# Patient Record
Sex: Female | Born: 1980 | Race: White | Hispanic: No | Marital: Married | Smoking: Current some day smoker
Health system: Southern US, Community
[De-identification: ages and names within clinical notes are randomized; demographics above are authoritative.]

## PROBLEM LIST (undated history)

## (undated) DIAGNOSIS — K581 Irritable bowel syndrome with constipation: Secondary | ICD-10-CM

## (undated) DIAGNOSIS — L0390 Cellulitis, unspecified: Secondary | ICD-10-CM

## (undated) DIAGNOSIS — R21 Rash and other nonspecific skin eruption: Secondary | ICD-10-CM

## (undated) DIAGNOSIS — I519 Heart disease, unspecified: Secondary | ICD-10-CM

## (undated) DIAGNOSIS — I509 Heart failure, unspecified: Secondary | ICD-10-CM

## (undated) DIAGNOSIS — F209 Schizophrenia, unspecified: Secondary | ICD-10-CM

## (undated) DIAGNOSIS — N939 Abnormal uterine and vaginal bleeding, unspecified: Secondary | ICD-10-CM

## (undated) HISTORY — DX: Schizophrenia, unspecified: F20.9

## (undated) HISTORY — DX: Heart failure, unspecified: I50.9

## (undated) HISTORY — DX: Heart disease, unspecified: I51.9

---
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
## 2005-12-15 HISTORY — PX: TUBAL LIGATION: SHX77

## 2009-07-08 ENCOUNTER — Emergency Department (HOSPITAL_COMMUNITY): Admission: EM | Admit: 2009-07-08 | Discharge: 2009-07-08 | Payer: Self-pay | Admitting: Emergency Medicine

## 2009-12-07 ENCOUNTER — Observation Stay (HOSPITAL_COMMUNITY): Admission: EM | Admit: 2009-12-07 | Discharge: 2009-12-07 | Payer: Self-pay | Admitting: Emergency Medicine

## 2011-03-17 LAB — CBC
HCT: 37.2 % (ref 36.0–46.0)
Hemoglobin: 12.7 g/dL (ref 12.0–15.0)
MCHC: 34.2 g/dL (ref 30.0–36.0)
MCV: 95.1 fL (ref 78.0–100.0)
Platelets: 202 10*3/uL (ref 150–400)
RBC: 3.91 MIL/uL (ref 3.87–5.11)
WBC: 6.5 10*3/uL (ref 4.0–10.5)

## 2011-03-17 LAB — URINALYSIS, ROUTINE W REFLEX MICROSCOPIC
Bilirubin Urine: NEGATIVE
Glucose, UA: NEGATIVE mg/dL
Hgb urine dipstick: NEGATIVE
Protein, ur: NEGATIVE mg/dL
Specific Gravity, Urine: 1.01 (ref 1.005–1.030)
Urobilinogen, UA: 0.2 mg/dL (ref 0.0–1.0)
pH: 7.5 (ref 5.0–8.0)

## 2011-03-17 LAB — COMPREHENSIVE METABOLIC PANEL
ALT: 10 U/L (ref 0–35)
AST: 17 U/L (ref 0–37)
Albumin: 3.4 g/dL — ABNORMAL LOW (ref 3.5–5.2)
BUN: 5 mg/dL — ABNORMAL LOW (ref 6–23)
CO2: 26 mEq/L (ref 19–32)
Calcium: 8.1 mg/dL — ABNORMAL LOW (ref 8.4–10.5)
Chloride: 108 mEq/L (ref 96–112)
Creatinine, Ser: 0.56 mg/dL (ref 0.4–1.2)
GFR calc non Af Amer: >60 mL/min (ref >60)
Glucose, Bld: 101 mg/dL — ABNORMAL HIGH (ref 70–99)
Potassium: 4 mEq/L (ref 3.5–5.1)
Sodium: 137 mEq/L (ref 135–145)
Total Protein: 5.9 g/dL — ABNORMAL LOW (ref 6.0–8.3)

## 2011-03-17 LAB — SEDIMENTATION RATE: Sed Rate: 5 mm/hr (ref 0–22)

## 2011-03-17 LAB — POTASSIUM: Potassium: 3.6 mEq/L (ref 3.5–5.1)

## 2012-06-22 ENCOUNTER — Encounter (HOSPITAL_COMMUNITY): Payer: Self-pay | Admitting: *Deleted

## 2012-06-22 ENCOUNTER — Emergency Department (HOSPITAL_COMMUNITY)
Admission: EM | Admit: 2012-06-22 | Discharge: 2012-06-23 | Disposition: A | Discharge status: Home / Self Care | Payer: Medicaid Other | Attending: Emergency Medicine | Admitting: Emergency Medicine

## 2012-06-22 DIAGNOSIS — T7840XA Allergy, unspecified, initial encounter: Secondary | ICD-10-CM

## 2012-06-22 DIAGNOSIS — F17200 Nicotine dependence, unspecified, uncomplicated: Secondary | ICD-10-CM | POA: Insufficient documentation

## 2012-06-22 DIAGNOSIS — R21 Rash and other nonspecific skin eruption: Secondary | ICD-10-CM | POA: Insufficient documentation

## 2012-06-22 HISTORY — DX: Cellulitis, unspecified: L03.90

## 2012-06-22 MED ORDER — MORPHINE SULFATE 2 MG/ML IJ SOLN
2.0000 mg | Freq: Once | INTRAMUSCULAR | Status: AC
  Administered 2012-06-22: 2 mg via INTRAVENOUS
  Filled 2012-06-22: qty 1

## 2012-06-22 MED ORDER — EPINEPHRINE 0.3 MG/0.3ML IJ DEVI
INTRAMUSCULAR | Status: AC
  Administered 2012-06-22: 0.3 mg
  Filled 2012-06-22: qty 0.3

## 2012-06-22 MED ORDER — FAMOTIDINE IN NACL 20-0.9 MG/50ML-% IV SOLN
20.0000 mg | Freq: Once | INTRAVENOUS | Status: AC
  Administered 2012-06-22: 20 mg via INTRAVENOUS

## 2012-06-22 MED ORDER — PREDNISONE 20 MG PO TABS: ORAL_TABLET | ORAL | Status: DC

## 2012-06-22 MED ORDER — FAMOTIDINE IN NACL 20-0.9 MG/50ML-% IV SOLN
INTRAVENOUS | Status: AC
  Administered 2012-06-22: 20 mg via INTRAVENOUS
  Filled 2012-06-22: qty 50

## 2012-06-22 MED ORDER — METHYLPREDNISOLONE SODIUM SUCC 125 MG IJ SOLR
125.0000 mg | Freq: Once | INTRAMUSCULAR | Status: AC
  Administered 2012-06-22: 125 mg via INTRAVENOUS

## 2012-06-22 MED ORDER — EPINEPHRINE 0.3 MG/0.3ML IJ DEVI: 0.3000 mg | Freq: Once | INTRAMUSCULAR | Status: DC

## 2012-06-22 MED ORDER — METHYLPREDNISOLONE SODIUM SUCC 125 MG IJ SOLR
INTRAMUSCULAR | Status: AC
  Administered 2012-06-22: 125 mg via INTRAVENOUS
  Filled 2012-06-22: qty 2

## 2012-06-22 MED ORDER — EPINEPHRINE 0.3 MG/0.3ML IJ DEVI
INTRAMUSCULAR | Status: AC
  Filled 2012-06-22: qty 0.3

## 2012-06-22 NOTE — ED Notes (Signed)
Pt states taking cephalexin for cellulitis of the right foot. Woke up today with itching increase in severity 2 hours ago. Pt with swelling to right eye and face. Respirations easy non-labored. Controlling secretions without difficulty.

## 2012-06-22 NOTE — ED Notes (Signed)
Pt c/o allergic rx since Sunday, seen by PCP today and given "shots" unable to state what.  Husband at bedside, significant swelling to facial area, red rash to face, arms, throat. Pt states it is difficult to breathe.

## 2012-06-22 NOTE — ED Notes (Signed)
Pt c/o increase in swelling to face with pulling of the mouth to the right. NO visible increase in swelling noted. Dr Lew Dawes called to bedside. Respirations easy non labored.

## 2012-06-22 NOTE — ED Notes (Signed)
Pt states she does not fell like her face is swelling any more and would like to go home.

## 2012-06-22 NOTE — ED Provider Notes (Signed)
History     CSN: [id number]  Arrival date & time 06/22/12  1933   First MD Initiated Contact with Patient 06/22/12 1948      Chief Complaint  Patient presents with  . Allergic Reaction  . Chest Pain    (Consider location/radiation/quality/duration/timing/severity/associated sxs/prior treatment) Patient is a 31 y.o. female presenting with allergic reaction. The history is provided by the patient.  Allergic Reaction The primary symptoms are  shortness of breath, rash and urticaria. The primary symptoms do not include wheezing, cough, abdominal pain, nausea, vomiting, diarrhea, dizziness, palpitations or angioedema. Episode onset: over the past 3 days. The problem has been gradually worsening. This is a new problem.  The rash began today. The pain associated with the rash is mild. The rash is associated with itching.  Onset: 3 days ago. The urticaria has been unchanged since its onset. Urticaria is a new problem. Location: face arms legs and chest.  The onset of the reaction was associated with a new medication. Significant symptoms also include flushing and itching.    Past Medical History  Diagnosis Date  . Cellulitis     History reviewed. No pertinent past surgical history.  History reviewed. No pertinent family history.  History  Substance Use Topics  . Smoking status: Current Everyday Smoker  . Smokeless tobacco: Not on file  . Alcohol Use: No    OB History    Grav Para Term Preterm Abortions TAB SAB Ect Mult Living                  Review of Systems  Constitutional: Negative for fever, chills, diaphoresis and fatigue.  HENT: Positive for facial swelling. Negative for ear pain, congestion, sore throat, mouth sores, trouble swallowing, neck pain and neck stiffness.   Eyes: Negative.   Respiratory: Positive for chest tightness and shortness of breath. Negative for apnea, cough and wheezing.   Cardiovascular: Negative for chest pain, palpitations and leg swelling.    Gastrointestinal: Negative for nausea, vomiting, abdominal pain, diarrhea and abdominal distention.  Genitourinary: Negative for hematuria, flank pain, vaginal discharge, difficulty urinating and menstrual problem.  Musculoskeletal: Negative for back pain and gait problem.  Skin: Positive for flushing, itching and rash. Negative for pallor and wound.  Neurological: Negative for dizziness, tremors, seizures, syncope, facial asymmetry, numbness and headaches.  Psychiatric/Behavioral: Negative.   All other systems reviewed and are negative.    Allergies  Aspirin  Home Medications   Current Outpatient Rx  Name Route Sig Dispense Refill  . CEPHALEXIN 500 MG PO CAPS Oral Take 500 mg by mouth 4 (four) times daily.    Marland Kitchen CLOTRIMAZOLE 1 % EX CREA Topical Apply 1 application topically 2 (two) times daily.    Marland Kitchen FLUCONAZOLE 150 MG PO TABS Oral Take 150 mg by mouth once.      BP 109/68  Pulse 81  Temp 98.5 F (36.9 C)  Resp 23  SpO2 100%  Physical Exam  Nursing note and vitals reviewed. Constitutional: She is oriented to person, place, and time. She appears well-developed and well-nourished. No distress.  HENT:  Head: Normocephalic and atraumatic.  Right Ear: External ear normal.  Left Ear: External ear normal.  Nose: Nose normal.  Mouth/Throat: Oropharynx is clear and moist. No oropharyngeal exudate.       Patient with redness and swelling of her face bilaterally. Normal oropharynx with no swelling of tongue.  Eyes: Conjunctivae and EOM are normal. Pupils are equal, round, and reactive to light. Right eye  exhibits no discharge. Left eye exhibits no discharge.  Neck: Normal range of motion. Neck supple. No JVD present. No tracheal deviation present. No thyromegaly present.  Cardiovascular: Normal rate, regular rhythm, normal heart sounds and intact distal pulses.  Exam reveals no gallop and no friction rub.   No murmur heard. Pulmonary/Chest: Effort normal and breath sounds normal. No  respiratory distress. She has no wheezes. She has no rales. She exhibits no tenderness.  Abdominal: Soft. Bowel sounds are normal. She exhibits no distension. There is no tenderness. There is no rebound and no guarding.  Musculoskeletal: Normal range of motion.  Lymphadenopathy:    She has no cervical adenopathy.  Neurological: She is alert and oriented to person, place, and time. No cranial nerve deficit. Coordination normal.  Skin: Skin is warm. Rash noted. She is not diaphoretic.       Patient with a red itchy urticarial rash involves face neck chest legs and arms.  Psychiatric: She has a normal mood and affect. Her behavior is normal. Judgment and thought content normal.    ED Course  Procedures (including critical care time)  Labs Reviewed - No data to display No results found.   No diagnosis found.    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 Patient says that she's been having tightness in her chest swelling sensation in her throat swelling in her face flushing redness itchy raised rash that involves her face neck chest arms and legs. There's been going on for 3 days. She went to see her primary care physician today who gave her an unknown medication to help control her symptoms and may recur so she presented here. Here patient seems be having a type I hypersensitivity reaction. We'll give epinephrine and steroids Zantac Benadryl and fluids and will reassess improvement.   patient gradually appears to be improving. Discharging patient with 3 days of prednisone and she continues to improve after washing patient for 4 hours emergency department.  Case discussed with Dr. Christie Nottingham, MD 06/23/12 [id number]

## 2012-06-23 ENCOUNTER — Encounter (HOSPITAL_COMMUNITY): Payer: Self-pay | Admitting: *Deleted

## 2012-06-23 ENCOUNTER — Inpatient Hospital Stay (HOSPITAL_COMMUNITY)
Admission: EM | Admit: 2012-06-23 | Discharge: 2012-06-25 | DRG: 607 | Disposition: A | Discharge status: Home / Self Care | Payer: Medicaid Other | Attending: Internal Medicine | Admitting: Internal Medicine

## 2012-06-23 ENCOUNTER — Emergency Department (HOSPITAL_COMMUNITY): Payer: Medicaid Other

## 2012-06-23 DIAGNOSIS — L02619 Cutaneous abscess of unspecified foot: Secondary | ICD-10-CM | POA: Diagnosis present

## 2012-06-23 DIAGNOSIS — R21 Rash and other nonspecific skin eruption: Secondary | ICD-10-CM | POA: Diagnosis present

## 2012-06-23 DIAGNOSIS — R071 Chest pain on breathing: Secondary | ICD-10-CM

## 2012-06-23 DIAGNOSIS — W57XXXA Bitten or stung by nonvenomous insect and other nonvenomous arthropods, initial encounter: Secondary | ICD-10-CM

## 2012-06-23 DIAGNOSIS — E860 Dehydration: Secondary | ICD-10-CM | POA: Diagnosis present

## 2012-06-23 DIAGNOSIS — Z882 Allergy status to sulfonamides status: Secondary | ICD-10-CM

## 2012-06-23 DIAGNOSIS — L0390 Cellulitis, unspecified: Secondary | ICD-10-CM | POA: Diagnosis present

## 2012-06-23 DIAGNOSIS — F17200 Nicotine dependence, unspecified, uncomplicated: Secondary | ICD-10-CM | POA: Diagnosis present

## 2012-06-23 DIAGNOSIS — E876 Hypokalemia: Secondary | ICD-10-CM | POA: Diagnosis present

## 2012-06-23 DIAGNOSIS — R55 Syncope and collapse: Secondary | ICD-10-CM | POA: Diagnosis present

## 2012-06-23 DIAGNOSIS — T361X5A Adverse effect of cephalosporins and other beta-lactam antibiotics, initial encounter: Secondary | ICD-10-CM | POA: Diagnosis present

## 2012-06-23 DIAGNOSIS — I10 Essential (primary) hypertension: Secondary | ICD-10-CM

## 2012-06-23 DIAGNOSIS — T50904A Poisoning by unspecified drugs, medicaments and biological substances, undetermined, initial encounter: Secondary | ICD-10-CM

## 2012-06-23 DIAGNOSIS — T783XXA Angioneurotic edema, initial encounter: Secondary | ICD-10-CM | POA: Diagnosis present

## 2012-06-23 DIAGNOSIS — L270 Generalized skin eruption due to drugs and medicaments taken internally: Principal | ICD-10-CM | POA: Diagnosis present

## 2012-06-23 DIAGNOSIS — L03119 Cellulitis of unspecified part of limb: Secondary | ICD-10-CM | POA: Diagnosis present

## 2012-06-23 DIAGNOSIS — T887XXA Unspecified adverse effect of drug or medicament, initial encounter: Secondary | ICD-10-CM

## 2012-06-23 DIAGNOSIS — Y92009 Unspecified place in unspecified non-institutional (private) residence as the place of occurrence of the external cause: Secondary | ICD-10-CM

## 2012-06-23 DIAGNOSIS — R0789 Other chest pain: Secondary | ICD-10-CM | POA: Diagnosis present

## 2012-06-23 HISTORY — DX: Rash and other nonspecific skin eruption: R21

## 2012-06-23 LAB — CBC WITH DIFFERENTIAL/PLATELET
Basophils Relative: 0 % (ref 0–1)
Eosinophils Absolute: 0 10*3/uL (ref 0.0–0.7)
Eosinophils Relative: 0 % (ref 0–5)
Hemoglobin: 12.8 g/dL (ref 12.0–15.0)
Lymphs Abs: 1.2 10*3/uL (ref 0.7–4.0)
MCH: 31.9 pg (ref 26.0–34.0)
Neutro Abs: 13.3 10*3/uL — ABNORMAL HIGH (ref 1.7–7.7)
Neutrophils Relative %: 88 % — ABNORMAL HIGH (ref 43–77)
Platelets: 278 10*3/uL (ref 150–400)
RBC: 4.01 MIL/uL (ref 3.87–5.11)
RDW: 13 % (ref 11.5–15.5)

## 2012-06-23 LAB — BASIC METABOLIC PANEL
BUN: 6 mg/dL (ref 6–23)
Calcium: 9 mg/dL (ref 8.4–10.5)
Chloride: 107 mEq/L (ref 96–112)
GFR calc Af Amer: >90 mL/min (ref >90)
GFR calc non Af Amer: >90 mL/min (ref >90)
Glucose, Bld: 145 mg/dL — ABNORMAL HIGH (ref 70–99)
Potassium: 3.4 mEq/L — ABNORMAL LOW (ref 3.5–5.1)
Sodium: 140 mEq/L (ref 135–145)

## 2012-06-23 LAB — SEDIMENTATION RATE: Sed Rate: 30 mm/hr — ABNORMAL HIGH (ref 0–22)

## 2012-06-23 MED ORDER — PREDNISONE 50 MG PO TABS
50.0000 mg | ORAL_TABLET | Freq: Every day | ORAL | Status: DC
  Administered 2012-06-23 – 2012-06-25 (×2): 50 mg via ORAL
  Filled 2012-06-23 (×3): qty 1

## 2012-06-23 MED ORDER — VANCOMYCIN HCL IN DEXTROSE 1-5 GM/200ML-% IV SOLN
1000.0000 mg | Freq: Two times a day (BID) | INTRAVENOUS | Status: DC
  Administered 2012-06-23: 1000 mg via INTRAVENOUS
  Filled 2012-06-23 (×3): qty 200

## 2012-06-23 MED ORDER — DOXYCYCLINE HYCLATE 100 MG PO TABS
100.0000 mg | ORAL_TABLET | Freq: Two times a day (BID) | ORAL | Status: DC
  Administered 2012-06-24 – 2012-06-25 (×3): 100 mg via ORAL
  Filled 2012-06-23 (×4): qty 1

## 2012-06-23 MED ORDER — FAMOTIDINE IN NACL 20-0.9 MG/50ML-% IV SOLN
20.0000 mg | Freq: Two times a day (BID) | INTRAVENOUS | Status: DC
  Administered 2012-06-23 – 2012-06-24 (×2): 20 mg via INTRAVENOUS
  Filled 2012-06-23 (×3): qty 50

## 2012-06-23 MED ORDER — DIPHENHYDRAMINE HCL 50 MG/ML IJ SOLN
INTRAMUSCULAR | Status: AC
  Administered 2012-06-23: 50 mg
  Filled 2012-06-23: qty 1

## 2012-06-23 MED ORDER — CLINDAMYCIN PHOSPHATE 600 MG/50ML IV SOLN
600.0000 mg | Freq: Once | INTRAVENOUS | Status: AC
  Administered 2012-06-23: 600 mg via INTRAVENOUS
  Filled 2012-06-23: qty 50

## 2012-06-23 MED ORDER — DIPHENHYDRAMINE HCL 50 MG/ML IJ SOLN
25.0000 mg | Freq: Four times a day (QID) | INTRAMUSCULAR | Status: DC | PRN
  Administered 2012-06-23 – 2012-06-25 (×5): 25 mg via INTRAVENOUS
  Filled 2012-06-23 (×6): qty 1

## 2012-06-23 MED ORDER — SODIUM CHLORIDE 0.9 % IV SOLN: Freq: Once | INTRAVENOUS | Status: DC

## 2012-06-23 MED ORDER — DOXYCYCLINE HYCLATE 100 MG IV SOLR
100.0000 mg | Freq: Two times a day (BID) | INTRAVENOUS | Status: DC
  Administered 2012-06-23: 100 mg via INTRAVENOUS
  Filled 2012-06-23 (×2): qty 100

## 2012-06-23 MED ORDER — OXYCODONE HCL 5 MG PO TABS
5.0000 mg | ORAL_TABLET | ORAL | Status: DC | PRN
  Administered 2012-06-24 – 2012-06-25 (×2): 5 mg via ORAL
  Filled 2012-06-23 (×2): qty 1

## 2012-06-23 MED ORDER — ACETAMINOPHEN 325 MG PO TABS
650.0000 mg | ORAL_TABLET | Freq: Four times a day (QID) | ORAL | Status: DC | PRN
  Administered 2012-06-24 (×2): 650 mg via ORAL
  Filled 2012-06-23 (×2): qty 2

## 2012-06-23 MED ORDER — SODIUM CHLORIDE 0.9 % IJ SOLN
3.0000 mL | Freq: Two times a day (BID) | INTRAMUSCULAR | Status: DC
  Administered 2012-06-23 – 2012-06-25 (×2): 3 mL via INTRAVENOUS

## 2012-06-23 MED ORDER — ACETAMINOPHEN 650 MG RE SUPP: 650.0000 mg | Freq: Four times a day (QID) | RECTAL | Status: DC | PRN

## 2012-06-23 MED ORDER — POTASSIUM CHLORIDE IN NACL 20-0.9 MEQ/L-% IV SOLN
INTRAVENOUS | Status: DC
  Administered 2012-06-23: via INTRAVENOUS
  Administered 2012-06-24: 125 mL via INTRAVENOUS
  Filled 2012-06-23 (×4): qty 1000

## 2012-06-23 MED ORDER — ONDANSETRON HCL 4 MG/2ML IJ SOLN: 4.0000 mg | Freq: Four times a day (QID) | INTRAMUSCULAR | Status: DC | PRN

## 2012-06-23 MED ORDER — ONDANSETRON HCL 4 MG PO TABS: 4.0000 mg | ORAL_TABLET | Freq: Four times a day (QID) | ORAL | Status: DC | PRN

## 2012-06-23 MED ORDER — ALBUTEROL SULFATE (5 MG/ML) 0.5% IN NEBU: 2.5000 mg | INHALATION_SOLUTION | RESPIRATORY_TRACT | Status: DC | PRN

## 2012-06-23 MED ORDER — ENOXAPARIN SODIUM 40 MG/0.4ML ~~LOC~~ SOLN
40.0000 mg | SUBCUTANEOUS | Status: DC
  Administered 2012-06-23 – 2012-06-24 (×2): 40 mg via SUBCUTANEOUS
  Filled 2012-06-23 (×3): qty 0.4

## 2012-06-23 NOTE — ED Notes (Signed)
Airway patent at this time, NAD noted, patient able to speak in full sentences.

## 2012-06-23 NOTE — ED Notes (Signed)
Patient with syncopal episode this am, patient seen at that time by primary MD, patient had been started on Keflex x 2 days with some allergic reaction symptoms, patient has received Epi 0.3 mg SQ, Decadron 10mg  IV, Benadryl 50 mg IV, Zantac 50 mg IV, patient has been seen at primary MD for foot infection

## 2012-06-23 NOTE — ED Provider Notes (Addendum)
31 year old female comes in because of ongoing difficulty with the rash. She was diagnosed as cellulitis of her left foot and started on Keflex about 7 days ago. She started breaking out in a rash several days later and rash has been progressing. She was seen in the emergency department yesterday and stopped Keflex. She's continuing to have problems with rash. Rash is present on arms, chest as well as the left foot. She thinks she has been running low-grade fevers but has not checked her temperature. She does have a history of tick exposure. Exam shows some erythema of the dorsum of the left foot, but there is a papular rash present which is raised and nonblanching. This rash is also present over the chest and in areas of lower arms and in the intertriginous areas of her fingers. There is noted erythematous papular rash on the palms or soles, but the plantar surface of the right foot does have some scaling of skin and some cracking. Picture does not seem typical of an allergic reaction. I am definitely concerned about possibility of a tickborne illness such as [facility] spotted fever. She will need to be admitted as an outpatient treatment failure and was be started on doxycycline.  Dione Booze, MD 06/23/12 1651   Date: 06/23/2012  Rate: 104  Rhythm: sinus tachycardia  QRS Axis: normal  Intervals: normal  ST/T Wave abnormalities: nonspecific ST/T changes  Conduction Disutrbances:none  Narrative Interpretation: Borderline sinus tachycardia, borderline left atrial hypertrophy, nonspecific ST and T changes. When compared with ECG of July 9,2013, no significant changes are seen.  Old EKG Reviewed: unchanged    Dione Booze, MD 06/23/12 2006  Dione Booze, MD 06/23/12 2006

## 2012-06-23 NOTE — ED Provider Notes (Signed)
History     CSN: [id number]  Arrival date & time 06/23/12  1514   First MD Initiated Contact with Patient 06/23/12 1517      Chief Complaint  Patient presents with  . Allergic Reaction    (Consider location/radiation/quality/duration/timing/severity/associated sxs/prior treatment) Patient is a 31 y.o. female presenting with allergic reaction. The history is provided by the patient.  Allergic Reaction The primary symptoms are  shortness of breath, nausea, vomiting, dizziness, rash and angioedema. The primary symptoms do not include wheezing, cough, abdominal pain, diarrhea or palpitations. The current episode started 6 to 12 hours ago. The problem has been gradually improving. This is a recurrent problem.  The shortness of breath began yesterday. The shortness of breath developed gradually. The shortness of breath is mild.  Dizziness also occurs with nausea and vomiting. Dizziness does not occur with diaphoresis.  The rash is associated with itching.  The angioedema is associated with shortness of breath.   The onset of the reaction was associated with a new medication. Significant symptoms also include itching.    Past Medical History  Diagnosis Date  . Cellulitis     History reviewed. No pertinent past surgical history.  History reviewed. No pertinent family history.  History  Substance Use Topics  . Smoking status: Current Everyday Smoker  . Smokeless tobacco: Not on file  . Alcohol Use: No    OB History    Grav Para Term Preterm Abortions TAB SAB Ect Mult Living                  Review of Systems  Constitutional: Negative for fever, chills, diaphoresis and fatigue.  HENT: Negative for ear pain, congestion, sore throat, facial swelling, mouth sores, trouble swallowing, neck pain and neck stiffness.   Eyes: Negative.   Respiratory: Positive for shortness of breath. Negative for apnea, cough, chest tightness and wheezing.   Cardiovascular: Negative for chest pain,  palpitations and leg swelling.  Gastrointestinal: Positive for nausea and vomiting. Negative for abdominal pain, diarrhea and abdominal distention.  Genitourinary: Negative for hematuria, flank pain, vaginal discharge, difficulty urinating and menstrual problem.  Musculoskeletal: Negative for back pain and gait problem.  Skin: Positive for itching and rash. Negative for wound.  Neurological: Positive for dizziness. Negative for tremors, seizures, syncope, facial asymmetry, numbness and headaches.  Psychiatric/Behavioral: Negative.   All other systems reviewed and are negative.    Allergies  Sulfa antibiotics and Aspirin  Home Medications   Current Outpatient Rx  Name Route Sig Dispense Refill  . CEPHALEXIN 500 MG PO CAPS Oral Take 500 mg by mouth 4 (four) times daily.    Marland Kitchen FLUCONAZOLE 150 MG PO TABS Oral Take 150 mg by mouth once.    . IBUPROFEN 200 MG PO TABS Oral Take 400 mg by mouth once.      BP 121/68  Temp 99.1 F (37.3 C) (Oral)  Resp 18  SpO2 100%  LMP 06/16/2012  Physical Exam  Nursing note and vitals reviewed. Constitutional: She is oriented to person, place, and time. She appears well-developed and well-nourished. No distress.  HENT:  Head: Normocephalic and atraumatic.  Right Ear: External ear normal.  Left Ear: External ear normal.  Nose: Nose normal.  Mouth/Throat: Oropharynx is clear and moist. No oropharyngeal exudate.  Eyes: Conjunctivae and EOM are normal. Pupils are equal, round, and reactive to light. Right eye exhibits no discharge. Left eye exhibits no discharge.  Neck: Normal range of motion. Neck supple. No JVD present. No  tracheal deviation present. No thyromegaly present.  Cardiovascular: Normal rate, regular rhythm, normal heart sounds and intact distal pulses.  Exam reveals no gallop and no friction rub.   No murmur heard. Pulmonary/Chest: Effort normal and breath sounds normal. No respiratory distress. She has no wheezes. She has no rales. She  exhibits tenderness (patient with tenderness to palpation of the rash on her chest and in her left chest wall.).  Abdominal: Soft. Bowel sounds are normal. She exhibits no distension. There is no tenderness. There is no rebound and no guarding.  Musculoskeletal: Normal range of motion.  Lymphadenopathy:    She has no cervical adenopathy.  Neurological: She is alert and oriented to person, place, and time. No cranial nerve deficit. Coordination normal.  Skin: Skin is warm. Rash ( pattiienntt witthh rred raiiseed raash on chest and legs. Patient also with red streaking on right foot with skin crusting but no signs of abscess.) noted. She is not diaphoretic.  Psychiatric: She has a normal mood and affect. Her behavior is normal. Judgment and thought content normal.    ED Course  Procedures (including critical care time)  Labs Reviewed  BASIC METABOLIC PANEL - Abnormal; Notable for the following:    Potassium 3.4 (*)     Glucose, Bld 145 (*)     All other components within normal limits  CBC WITH DIFFERENTIAL - Abnormal; Notable for the following:    WBC 15.2 (*)     Neutrophils Relative 88 (*)     Neutro Abs 13.3 (*)     Lymphocytes Relative 8 (*)     All other components within normal limits   No results found.   No diagnosis found.    MDM  31 year old female patient who was here yesterday with an apparent reaction to Keflex for soft tissue infection of her right foot read presents with continued rash swelling in her face. Patient was treated with epinephrine Solu-Medrol Benadryl and Zantac yesterday with resolution of symptoms and improvement in exam. Patient says she went home feeling better this morning woke up with nausea vomiting temperatures to 99 and pain with rash in her chest and worsening of the red rash in her right foot that was initially being treated with Keflex. Patient also states that she's been bitten by many ticks in the past couple weeks. Patient with swelling of  her face consistent with what was yesterday red rash on her chest pain with palpation of her chest and of the rash on her chest and redness and streaking pattern on her right foot. No abdominal pain no headache neck pain. Swelling face seems consistent with allergic reaction but rash seems somewhat atypical for a type I hypersensitivity reaction. Concern given the rash on her foot and recent exposure to ticks for infectious etiology for the symptoms. Will give dose of doxycycline to cover for rickettsial disease and and clindamycin cover for cellulitic infections. EKG is reassuring chest x-ray is normal and labs are normal. Given the description of the chest pain being sharp associated with rash, young age, with a TIMI score Well's 0 and PERC - doubt emergent cause of chest pain. I will likely need to admit patient for observation to ensure rash improves.  Results for orders placed during the hospital encounter of 06/23/12  BASIC METABOLIC PANEL      Component Value Range   Sodium 140  135 - 145 mEq/L   Potassium 3.4 (*) 3.5 - 5.1 mEq/L   Chloride 107  96 -  112 mEq/L   CO2 20  19 - 32 mEq/L   Glucose, Bld 145 (*) 70 - 99 mg/dL   BUN 6  6 - 23 mg/dL   Creatinine, Ser 1.61  0.50 - 1.10 mg/dL   Calcium 9.0  8.4 - 09.6 mg/dL   GFR calc non Af Amer >90  >90 mL/min   GFR calc Af Amer >90  >90 mL/min  CBC WITH DIFFERENTIAL      Component Value Range   WBC 15.2 (*) 4.0 - 10.5 K/uL   RBC 4.01  3.87 - 5.11 MIL/uL   Hemoglobin 12.8  12.0 - 15.0 g/dL   HCT 04.5  40.9 - 81.1 %   MCV 94.5  78.0 - 100.0 fL   MCH 31.9  26.0 - 34.0 pg   MCHC 33.8  30.0 - 36.0 g/dL   RDW 91.4  78.2 - 95.6 %   Platelets 278  150 - 400 K/uL   Neutrophils Relative 88 (*) 43 - 77 %   Neutro Abs 13.3 (*) 1.7 - 7.7 K/uL   Lymphocytes Relative 8 (*) 12 - 46 %   Lymphs Abs 1.2  0.7 - 4.0 K/uL   Monocytes Relative 4  3 - 12 %   Monocytes Absolute 0.6  0.1 - 1.0 K/uL   Eosinophils Relative 0  0 - 5 %   Eosinophils Absolute 0.0   0.0 - 0.7 K/uL   Basophils Relative 0  0 - 1 %   Basophils Absolute 0.0  0.0 - 0.1 K/uL  SEDIMENTATION RATE      Component Value Range   Sed Rate 30 (*) 0 - 22 mm/hr       DG Chest 2 View (Final result)   Result time:06/23/12 1714    Final result by Rad Results In Interface (06/23/12 17:14:08)    Narrative:   *RADIOLOGY REPORT*  Clinical Data: Difficulty breathing, face neck and throat swelling, allergic reaction  CHEST - 2 VIEW  Comparison: None.  Findings: The lungs are clear. Mediastinal contours appear normal. The heart is within normal limits in size. No bony abnormality is seen.  IMPRESSION: No active lung disease.  Original Report Authenticated By: Juline Patch, M.D.    Date: 06/24/2012  Rate: 102  Rhythm: normal sinus rhythm  QRS Axis: normal  Intervals: normal  ST/T Wave abnormalities: normal  Conduction Disutrbances:none  Narrative Interpretation:   Old EKG Reviewed: unchanged    Per the request of the admitting physician infectious disease was consulted. Patient is a hospitalist for observation.  Case discussed with Dr. Blanche East, MD 06/24/12 [id number]

## 2012-06-23 NOTE — ED Provider Notes (Signed)
I saw and evaluated the patient, reviewed the resident's note and I agree with the findings and plan.   Matie Dimaano, MD 06/23/12 1513 

## 2012-06-23 NOTE — Progress Notes (Signed)
ANTIBIOTIC CONSULT NOTE - INITIAL  Pharmacy Consult for Vancomycin Indication: cellulitis of rt leg/foot  Allergies  Allergen Reactions  . Sulfa Antibiotics Anaphylaxis and Hives  . Aspirin Swelling and Rash    Patient Measurements: Height: 5\' 6"  (167.6 cm) Weight: 149 lb 11.1 oz (67.9 kg) IBW/kg (Calculated) : 59.3  Adjusted Body Weight:   Vital Signs: Temp: 99.1 F (37.3 C) (07/10 1518) Temp src: Oral (07/10 1518) BP: 105/62 mmHg (07/10 1912) Pulse Rate: 96  (07/10 1912) Intake/Output from previous day:   Intake/Output from this shift:    Labs:  Basename 06/23/12 1554  WBC 15.2*  HGB 12.8  PLT 278  LABCREA --  CREATININE 0.60   Estimated Creatinine Clearance: 96.3 ml/min (by C-G formula based on Cr of 0.6). No results found for this basename: VANCOTROUGH:2,VANCOPEAK:2,VANCORANDOM:2,GENTTROUGH:2,GENTPEAK:2,GENTRANDOM:2,TOBRATROUGH:2,TOBRAPEAK:2,TOBRARND:2,AMIKACINPEAK:2,AMIKACINTROU:2,AMIKACIN:2, in the last 72 hours   Microbiology: No results found for this or any previous visit (from the past 720 hour(s)).  Medical History: Past Medical History  Diagnosis Date  . Cellulitis   . Skin rash     Medications:  Scheduled:    . clindamycin (CLEOCIN) IV  600 mg Intravenous Once  . diphenhydrAMINE      . doxycycline  100 mg Oral Q12H  . enoxaparin (LOVENOX) injection  40 mg Subcutaneous Q24H  . famotidine (PEPCID) IV  20 mg Intravenous Q12H  . predniSONE  50 mg Oral Q breakfast  . sodium chloride  3 mL Intravenous Q12H  . DISCONTD: sodium chloride   Intravenous Once  . DISCONTD: doxycycline (VIBRAMYCIN) IV  100 mg Intravenous Q12H   Assessment: 31 yr old female was being treated by her PCP for cellulitis of her right foot with Keflex and fluconazole. After 4 days of treatment she started to develop rashes, swelling of her face and tongue. She was seen by her PCP and in the ED, was treated for an allergic reaction and sent home. She is now admitted for failure  of outpatient treatment of her cellulitis. She was given a dose of clindamycin in the ED. She is also receiving doxycycline since she had a recent tick bite so they are covering for possible RMSF.  Goal of Therapy:  Vancomycin trough level 10-15 mcg/ml  Plan:  Vancomycin 1 Gm IV q12hrs Vancomycin trough when appropriate.  Eugene Garnet 06/23/2012,8:41 PM

## 2012-06-23 NOTE — ED Notes (Signed)
Family and pt informed about the wait for admission

## 2012-06-23 NOTE — H&P (Signed)
PCP:   LITTLE,JAMES, MD   Chief Complaint:  Skin rash, itching, chest pain and an episode of passing out.  HPI: 31 year old Caucasian female patient gives history of chronic right foot rash/lesion ongoing for approximately 4 years. She did not seek medical attention for same and attributed it to athletes foot. Approximately a month ago this area started getting worse with worsening redness, pain, blisters and painful ambulation. One week ago she was seen at [facility] and was told to have fungal infection and cellulitis of the right foot and prescribed fluconazole and Keflex. She took it for about 4 days without improvement in her right foot. 4 days into the antibiotics she started noticing pruritic red skin rash in other parts of her body including left forearm, anterior chest, behind the ears, webspace of her fingers and on the face. These rashes were painful. She also noticed swelling of her face and eventually had swelling of her tongue and had difficulty swallowing for which she was seen by her PCP on 7/9. She was given an EpiPen shot and Benadryl shot and sent home. However on returning home her symptoms returned and patient presented to the emergency department. She was treated with Solu-Medrol, Benadryl and Zantac in the ED, improved some and was discharged home. This morning patient woke up and noticed again worsening of her face swelling and rash. She also complained of low-grade fever. She had anterior chest pain which is only on deep inspiration and on touching the rash. She denies current dyspnea. She has some cough with intermittent yellow sputum. She went to the bathroom and without premonitory symptoms passed out for an undetermined bout of times. There was no preceding dizziness or lightheadedness or chest pain or palpitations. On waking up she did not notice any injuries or bleeding. She ambulated unsteadily back to her bed. When her husband came by, he found her somnolent and not  very responsive. She was brought to the emergency department. She has received a dose of clindamycin and doxycycline for presumed cellulitis of her right foot and the hospitalist service is requested to admit for further evaluation and management.   [place] and frequently gets bitten by insects. She gives history of allergic reactions to insect bites where she starts shaking and has received an EpiPen shot almost yearly for the last few years. According to her spouse,? Change in color of her right foot intermittently over the years. No joint pains. Patient's mother also has history of allergies.   Past Medical History: Past Medical History  Diagnosis Date  . Cellulitis   . Skin rash     Past Surgical History: Past Surgical History  Procedure Date  . Dental extra     Allergies:   Allergies  Allergen Reactions  . Sulfa Antibiotics Anaphylaxis and Hives  . Aspirin Swelling and Rash    Medications: Prior to Admission medications   Medication Sig Start Date End Date Taking? Authorizing Provider  cephALEXin (KEFLEX) 500 MG capsule Take 500 mg by mouth 4 (four) times daily.   Yes Historical Provider, MD  fluconazole (DIFLUCAN) 150 MG tablet Take 150 mg by mouth once.   Yes Historical Provider, MD  ibuprofen (ADVIL,MOTRIN) 200 MG tablet Take 400 mg by mouth once.   Yes Historical Provider, MD    Family History: Family History  Problem Relation Age of Onset  . Diabetes Mother     Social History:  reports that she has been smoking.  She does  not have any smokeless tobacco history on file. She reports that she does not drink alcohol or use illicit drugs. married. Independent of activities of daily living.  Review of Systems:  All systems reviewed and apart from history of presenting illness is pertinent for an episode of vomiting and diarrhea. No abdominal pain, dysuria or urinary frequency. No headache. All other systems negative.  Physical  Exam: Filed Vitals:   06/23/12 1518 06/23/12 1912  BP: 121/68 105/62  Pulse:  96  Temp: 99.1 F (37.3 C)   TempSrc: Oral   Resp: 18 18  SpO2: 100% 100%   General appearance: Moderately built and nourished patient who is lying comfortably in the gurney and is in no obvious distress.  Head: Nontraumatic and normocephalic.  Eyes: Pupils equally reacting to light and accommodation.  Ears: Normal  Nose: No acute findings. No sinus tenderness.  Throat: Mucosa is  Dry . No oral thrush.  Neck: Supple. No JVD or carotid bruit. Lymph nodes: No lymphadenopathy.  Resp: Clear to auscultation. No increased work of breathing.  Cardio: First and second heart sounds heard, regular rate and rythm. No murmurs or JVD or gallop or pedal edema.   GI: Non distended. Soft and nontender. No organomegaly or masses appreciated. Normal bowel sounds heard. umbilical piercing.  Extremities: symmetric 5/5 power. Skin:  patient has patchy red macular, nonpitting type of edema or rash on bilateral cheeks, similar mild rash behind the ears. left forearm has a large area of similar rash. She has some redness in the webspace of her fingers and toes. Patchy redness on the dorsum of the right foot. No no increased temperature, open wounds or drainage on right foot. Skin underneath the right forefoot is thickened with some healed blisters and is tender. All her skin rashes seem to be tender to touch. Facial rash also blanches to touch.  Neurologic: Alert and oriented. No focal neurological deficits.   Labs on Admission:   [facility] 06/23/12 1554  NA 140  K 3.4*  CL 107  CO2 20  GLUCOSE 145*  BUN 6  CREATININE 0.60  CALCIUM 9.0  MG --  PHOS --   No results found for this basename: AST:2,ALT:2,ALKPHOS:2,BILITOT:2,PROT:2,ALBUMIN:2 in the last 72 hours No results found for this basename: LIPASE:2,AMYLASE:2 in the last 72 hours  Basename 06/23/12 1554  WBC 15.2*  NEUTROABS 13.3*  HGB 12.8  HCT 37.9  MCV 94.5   PLT 278   No results found for this basename: CKTOTAL:3,CKMB:3,CKMBINDEX:3,TROPONINI:3 in the last 72 hours No results found for this basename: TSH,T4TOTAL,FREET3,T3FREE,THYROIDAB in the last 72 hours No results found for this basename: VITAMINB12:2,FOLATE:2,FERRITIN:2,TIBC:2,IRON:2,RETICCTPCT:2 in the last 72 hours  Radiological Exams on Admission: Dg Chest 2 View  06/23/2012  *RADIOLOGY REPORT*  Clinical Data: Difficulty breathing, face neck and throat swelling, allergic reaction  CHEST - 2 VIEW  Comparison: None.  Findings: The lungs are clear.  Mediastinal contours appear normal. The heart is within normal limits in size.  No bony abnormality is seen.  IMPRESSION: No active lung disease.  Original Report Authenticated By: Juline Patch, M.D.     EKG: Sinus tachycardia to 104 beats per minute, normal axis, no acute changes. QTC 463 ms.   Assessment/Plan Present on Admission:  .Skin rash .Allergic drug rash .Cellulitis .Tick bite .Syncope and collapse .Chest pain on breathing  1. Skin rash: New skin rash on her face, behind the ears, left forearm, fingers and right foot seem to be an allergic drug rash. She did  have features suggestive of anaphylaxis last night when she was seen in the emergency department. Discontinue Keflex and Diflucan. Admit for 23 hour observation. Placed on oral prednisone, IV Pepcid and Benadryl IV when necessary. Monitor closely. Unclear as to etiology of chronic right foot rash. Rule out connective tissue disorders-check ANA, ANCA and ACE levels. Given history of tick bites will also check for RMSF IgG and IgM.  2. Possible right foot cellulitis: Infectious disease consulted. Dr. Ninetta Lights recommends IV vancomycin and oral doxycycline (to cover for tick bite exposure). 3. Syncope: Possibly secondary to orthostatic hypotension from dehydration. Check orthostatic blood pressures. IV fluids and monitor on telemetry. 4. Dehydration: IV fluids. 5. Chest pain,  musculoskeletal/secondary to painful rash: Pain medications and monitor. 6. Hypokalemia: Replete IV fluids and monitor.  7. Leukocytosis 8. Full Code. This was confirmed with the patient.  Patient's care was discussed at length with the patient and with her spouse at the bedside.  Ardon Franklin 06/23/2012, 7:52 PM

## 2012-06-23 NOTE — ED Notes (Signed)
Report given.  Bed assigned but no orders yet

## 2012-06-23 NOTE — ED Notes (Signed)
Pt denies any complaints at this time. Respirations easy non labored. 

## 2012-06-24 DIAGNOSIS — R21 Rash and other nonspecific skin eruption: Secondary | ICD-10-CM

## 2012-06-24 DIAGNOSIS — R509 Fever, unspecified: Secondary | ICD-10-CM

## 2012-06-24 DIAGNOSIS — E876 Hypokalemia: Secondary | ICD-10-CM

## 2012-06-24 LAB — COMPREHENSIVE METABOLIC PANEL
AST: 12 U/L (ref 0–37)
Albumin: 3.8 g/dL (ref 3.5–5.2)
Alkaline Phosphatase: 56 U/L (ref 39–117)
BUN: 7 mg/dL (ref 6–23)
CO2: 22 mEq/L (ref 19–32)
Creatinine, Ser: 0.51 mg/dL (ref 0.50–1.10)
GFR calc non Af Amer: >90 mL/min (ref >90)
Potassium: 4 mEq/L (ref 3.5–5.1)
Sodium: 140 mEq/L (ref 135–145)
Total Protein: 6.9 g/dL (ref 6.0–8.3)

## 2012-06-24 LAB — PREGNANCY, URINE: Preg Test, Ur: NEGATIVE

## 2012-06-24 LAB — URINALYSIS, ROUTINE W REFLEX MICROSCOPIC
Bilirubin Urine: NEGATIVE
Hgb urine dipstick: NEGATIVE
Nitrite: NEGATIVE
Specific Gravity, Urine: 1.017 (ref 1.005–1.030)
pH: 6.5 (ref 5.0–8.0)

## 2012-06-24 LAB — CBC
HCT: 37.7 % (ref 36.0–46.0)
MCH: 31.6 pg (ref 26.0–34.0)
MCV: 96.2 fL (ref 78.0–100.0)
RBC: 3.92 MIL/uL (ref 3.87–5.11)
WBC: 11.3 10*3/uL — ABNORMAL HIGH (ref 4.0–10.5)

## 2012-06-24 LAB — ANCA SCREEN W REFLEX TITER
c-ANCA Screen: NEGATIVE
p-ANCA Screen: NEGATIVE

## 2012-06-24 LAB — ANA: Anti Nuclear Antibody(ANA): NEGATIVE

## 2012-06-24 LAB — C-REACTIVE PROTEIN: CRP: 0.14 mg/dL — ABNORMAL LOW (ref <0.60)

## 2012-06-24 LAB — HIV ANTIBODY (ROUTINE TESTING W REFLEX): HIV: NONREACTIVE

## 2012-06-24 MED ORDER — FAMOTIDINE 20 MG PO TABS
20.0000 mg | ORAL_TABLET | Freq: Two times a day (BID) | ORAL | Status: DC
  Administered 2012-06-24 – 2012-06-25 (×2): 20 mg via ORAL
  Filled 2012-06-24 (×3): qty 1

## 2012-06-24 MED ORDER — HYDROXYZINE HCL 10 MG PO TABS
10.0000 mg | ORAL_TABLET | Freq: Three times a day (TID) | ORAL | Status: DC | PRN
  Administered 2012-06-24 (×3): 10 mg via ORAL
  Filled 2012-06-24 (×5): qty 1

## 2012-06-24 NOTE — Progress Notes (Signed)
[facility] PROGRESS NOTE  Linda Tyler JXB:[id number] DOB: Aug 27, 1981 DOA: 06/23/2012 PCP: [name], MD  Assessment/Plan: Principal Problem:  *Skin rash Active Problems:  Allergic drug rash  Cellulitis  Tick bite  Syncope and collapse  Chest pain on breathing  Dehydration  1. Allergic skin rash with angioedema: Possibly do to cephalexin. Clinically improving but had some facial swelling recurrence this morning. List cephalexin as allergy. Continue prednisone, Pepcid and when necessary Benadryl. No airway issues. 2. Right foot cellulitis: Seems to have improved/resolved. ID consultation appreciated. DC vancomycin. Complete one week of oral doxycycline. 3. Hypokalemia: Repleted. 4. Syncope: Possibly secondary to dehydration and associated hypotension from same and allergic reaction. Orthostatic blood pressure checks negative. 5. Dehydration: Resolved. DC IV fluids. 6. Chest pain: Secondary to painful skin rash. Resolved. 7. Mild leukocytosis: Improved. 8. Exfoliative dermatitis of right foot: Unclear etiology. Outpatient followup and may benefit from dermatology consultation. 9. History of tick exposure. Low index of suspicion for RMSF. 10. Menorrhagia: Outpatient evaluation per PCP.  Code Status: Full Family Communication:  Disposition Plan: Monitor overnight and possible discharge on 06/25/12.  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    Consultants:  Infectious disease: Dr. Leodis Liverpool  Procedures:  None  Antibiotics:  Doxycycline  IV  vancomycin-discontinued   HPI/Subjective: No further chest pains. Skin rashes have improved. Her facial swelling had decreased but reoccurred this morning and required some Benadryl which helped. Denies dyspnea or noisy breathing.  Objective: Filed Vitals:   06/24/12 1117 06/24/12 1121 06/24/12 1122 06/24/12 1357  BP: 99/64 108/75 104/64 104/75  Pulse: 85 91 120 83  Temp:    98.6 F (37 C)  TempSrc:    Oral  Resp:    20  Height:      Weight:      SpO2:    98%    Intake/Output Summary (Last 24 hours) at 06/24/12 1445 Last data filed at 06/24/12 1300  Gross per 24 hour  Intake   1290 ml  Output    300 ml  Net    990 ml    Exam:  General appearance: Comfortable. Resp: Clear to auscultation. No increased work of breathing.  Cardio: First and second heart sounds heard, regular rate and rythm. No murmurs or JVD or gallop or pedal edema. Telemetry shows sinus rhythm. GI: Non distended. Soft and nontender. No organomegaly or masses appreciated. Normal bowel sounds heard. umbilical piercing.  Extremities: symmetric 5/5 power.  Skin: Reduced facial edema. Skin rash on her face, left forearm and anterior chest has significantly improved. Rash behind the ears, in the webspace of the fingers and toes has resolved. Neurologic: Alert and oriented. No focal neurological deficits.   Data Reviewed: Basic Metabolic Panel:  Lab 06/24/12 1308 06/23/12 1554  NA 140 140  K 4.0 3.4*  CL 105 107  CO2 22 20  GLUCOSE 137* 145*  BUN 7 6  CREATININE 0.51 0.60  CALCIUM 8.9 9.0  MG -- --  PHOS -- --   Liver Function Tests:  Lab 06/24/12 0605  AST 12  ALT 10  ALKPHOS 56  BILITOT 0.2*  PROT 6.9  ALBUMIN 3.8   No results found for this  basename: LIPASE:5,AMYLASE:5 in the last 168 hours No results found for this basename: AMMONIA:5 in the last 168 hours CBC:  Lab 06/24/12 0605 06/23/12 1554  WBC 11.3* 15.2*  NEUTROABS -- 13.3*  HGB 12.4 12.8  HCT 37.7 37.9  MCV 96.2 94.5  PLT  262 278   Cardiac Enzymes: No results found for this basename: CKTOTAL:5,CKMB:5,CKMBINDEX:5,TROPONINI:5 in the last 168 hours BNP (last 3 results) No results found for this basename: PROBNP:3 in the last 8760 hours CBG: No results found for this basename: GLUCAP:5 in the last 168 hours  No results found for this or any previous visit (from the past 240 hour(s)).   Urine pregnancy test negative. ANA negative. ACE negative ESR 30. CRP 0.14 RMSF IgG: 2.76. RMSF IgM: 0.32. UA: Not indicative of UTI.  Studies: Dg Chest 2 View  06/23/2012  *RADIOLOGY REPORT*  Clinical Data: Difficulty breathing, face neck and throat swelling, allergic reaction  CHEST - 2 VIEW  Comparison: None.  Findings: The lungs are clear.  Mediastinal contours appear normal. The heart is within normal limits in size.  No bony abnormality is seen.  IMPRESSION: No active lung disease.  Original Report Authenticated By: Juline Patch, M.D.    Scheduled Meds:    . clindamycin (CLEOCIN) IV  600 mg Intravenous Once  . diphenhydrAMINE      . doxycycline  100 mg Oral Q12H  . enoxaparin (LOVENOX) injection  40 mg Subcutaneous Q24H  . famotidine (PEPCID) IV  20 mg Intravenous Q12H  . predniSONE  50 mg Oral Q breakfast  . sodium chloride  3 mL Intravenous Q12H  . DISCONTD: sodium chloride   Intravenous Once  . DISCONTD: doxycycline (VIBRAMYCIN) IV  100 mg Intravenous Q12H  . DISCONTD: vancomycin  1,000 mg Intravenous Q12H   Continuous Infusions:    . DISCONTD: 0.9 % NaCl with KCl 20 mEq / L 125 mL (06/24/12 0905)     Merdith Boyd [facility] Pager [phone]  If 7PM-7AM, please contact night-coverage [email or web address] Password [email or web address] 06/24/2012, 2:45 PM   LOS: 1 day

## 2012-06-24 NOTE — ED Provider Notes (Signed)
I saw and evaluated the patient, reviewed the resident's note and I agree with the findings and plan.   Dione Booze, MD 06/24/12 1450

## 2012-06-24 NOTE — Consult Note (Signed)
Date of Admission:  06/23/2012  Date of Consult:  06/24/2012  Reason for Consult:fevers, rash,  Referring Physician: Dr. Waymon Amato  HPI: Linda Tyler is an 31 y.o. female with odd history of apparent severe allergic reactions to various insect bites including spiders, ticks that have caused seizure like episodes in the past. She additionally had a history of what she describes as a brown recluse spider bite on her arm with severe erythema. She was seen by PCP and apparently erythema extended from "head to toe" and she was told that she had a sever type of "some type of staph infection." On close questioning she states that no blood work was done so apparently she did not have a Staph Aureus bacteremia. She states that she was given two different antibiotics at that time and she also was having diffuse swelling and allergic symptoms at that time though she claims that her symptoms at that time preceded the antibiotics.  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 She states that this has been refractory to treatment with topical steroids, well as topical antifungals such as Lamisil .  Approximately a month ago this area started getting worse the actual foot and leg became severely erythematous.  He is having quite a bit of pain with ambulation  One week ago she was seen at [facility] and was apparently thought to have a cellulitis superimposed on chronic fungal infection. She was given some intravenous therapies and then prescribed  fluconazole and Keflex. . 4 days into the antibiotics she started noticing pruritic red skin rash in other parts of her body including left forearm, anterior chest, behind the ears, webspace of her fingers and on the face. These rashes were painful. She also noticed swelling of her face and eventually had swelling of her tongue and had difficulty swallowing for which she was seen by her PCP on 7/9. She was given an EpiPen shot and  Benadryl shot and sent home and told to stop her antibiotics. However on returning home her symptoms returned and patient presented to the emergency department. She was treated with Solu-Medrol, Benadryl and Zantac in the ED, improved some and was discharged home on the morning of admission the patient woke up and noticed again worsening of her face swelling and rash. She also complained of low-grade fever. She had anterior chest pain which was  only on deep inspiration and on touching the rash. She denies current dyspnea. She has some cough with intermittent yellow sputum. She went to the bathroom and without premonitory symptoms passed out for an undetermined bout of times. There was no preceding dizziness or lightheadedness or chest pain or palpitations.  She ambulated unsteadily back to her bed. When her husband came by, he found her somnolent and not very responsive. She was brought to the emergency department. She has received a dose of clindamycin and doxycycline for presumed cellulitis of her right foot and the hospitalist service admitted her. They had placed her on IV vancomycin as well as doxycycline and treated her for an allergic reaction with oral prednisone along with Benadryl and a nonsedating antihistamine.  Upon further questioning the patient did reveal that she has just fit finished a heavy menstrual period. She apparently had been having menorrhagia for approximately 3 weeks with the bleeding having subsided last Thursday. She states that she does not wear tampons but rather uses pads for this. Overnight here in the hospital she has had improvement in general  in her rash as well as or swelling. However this morning she did again have since a sensation of swelling in her throat and face and was again given Benadryl acutely. She is currently completely afebrile and her labs are unremarkable. I spent greater than 60 minutes with the patient including greater than 50% of time in face to face  counsel of the patient discussion with Dr. Waymon Amato and in coordination of their care.      Past Medical History  Diagnosis Date  . Cellulitis   . Skin rash     Past Surgical History  Procedure Date  . Dental extra   ergies:   Allergies  Allergen Reactions  . Sulfa Antibiotics Anaphylaxis and Hives  . Aspirin Swelling and Rash     Medications: I have reviewed patients current medications as documented in Epic Anti-infectives     Start     Dose/Rate Route Frequency Ordered Stop   06/24/12 1000   doxycycline (VIBRA-TABS) tablet 100 mg        100 mg Oral Every 12 hours 06/23/12 1951     06/23/12 2200   vancomycin (VANCOCIN) IVPB 1000 mg/200 mL premix        1,000 mg 200 mL/hr over 60 Minutes Intravenous Every 12 hours 06/23/12 2101     06/23/12 1600   doxycycline (VIBRAMYCIN) 100 mg in dextrose 5 % 250 mL IVPB  Status:  Discontinued        100 mg 125 mL/hr over 120 Minutes Intravenous Every 12 hours 06/23/12 1544 06/23/12 1951   06/23/12 1545   clindamycin (CLEOCIN) IVPB 600 mg        600 mg 100 mL/hr over 30 Minutes Intravenous  Once 06/23/12 1544 06/23/12 1746          Social History:  reports that she has been smoking.  She does not have any smokeless tobacco history on file. She reports that she does not drink alcohol or use illicit drugs.  Family History  Problem Relation Age of Onset  . Diabetes Mother     As in HPI and primary teams notes otherwise 12 point review of systems is negative  Blood pressure 112/77, pulse 68, temperature 98 F (36.7 C), temperature source Oral, resp. rate 20, height 5\' 6"  (1.676 m), weight 149 lb 11.1 oz (67.9 kg), last menstrual period 06/16/2012, SpO2 99.00%.  General: Alert and awake, oriented x3, not in any acute distress. HEENT: anicteric sclera, pupils reactive to light and accommodation, EOMI, oropharynx clear and without exudate, swelling of her eyelids and face CVS regular rate, normal r,  no murmur rubs or  gallops Chest: clear to auscultation bilaterally, no wheezing, rales or rhonchi Abdomen: soft nontender, nondistended, normal bowel sounds, Extremities: no  clubbing or edema noted bilaterally Skin: She has diffuse rash which was prominent on her chest as well as her left forearm it is erythematous and blanching as well as tender to palpation. Some areas of exfoliation between her skin and her fingers. Her right foot has some mild erythema that blanches and is less confluent than not found in other areas of her body. She has an exfoliation on the bottom of her right foot which is chronic in nature. Neuro: nonfocal, strength and sensation intact   Results for orders placed during the hospital encounter of 06/23/12 (from the past 48 hour(s))  BASIC METABOLIC PANEL     Status: Abnormal   Collection Time   06/23/12  3:54 PM  Component Value Range Comment   Sodium 140  135 - 145 mEq/L    Potassium 3.4 (*) 3.5 - 5.1 mEq/L    Chloride 107  96 - 112 mEq/L    CO2 20  19 - 32 mEq/L    Glucose, Bld 145 (*) 70 - 99 mg/dL    BUN 6  6 - 23 mg/dL    Creatinine, Ser 1.61  0.50 - 1.10 mg/dL    Calcium 9.0  8.4 - 09.6 mg/dL    GFR calc non Af Amer >90  >90 mL/min    GFR calc Af Amer >90  >90 mL/min   CBC WITH DIFFERENTIAL     Status: Abnormal   Collection Time   06/23/12  3:54 PM      Component Value Range Comment   WBC 15.2 (*) 4.0 - 10.5 K/uL    RBC 4.01  3.87 - 5.11 MIL/uL    Hemoglobin 12.8  12.0 - 15.0 g/dL    HCT 04.5  40.9 - 81.1 %    MCV 94.5  78.0 - 100.0 fL    MCH 31.9  26.0 - 34.0 pg    MCHC 33.8  30.0 - 36.0 g/dL    RDW 91.4  78.2 - 95.6 %    Platelets 278  150 - 400 K/uL    Neutrophils Relative 88 (*) 43 - 77 %    Neutro Abs 13.3 (*) 1.7 - 7.7 K/uL    Lymphocytes Relative 8 (*) 12 - 46 %    Lymphs Abs 1.2  0.7 - 4.0 K/uL    Monocytes Relative 4  3 - 12 %    Monocytes Absolute 0.6  0.1 - 1.0 K/uL    Eosinophils Relative 0  0 - 5 %    Eosinophils Absolute 0.0  0.0 - 0.7 K/uL     Basophils Relative 0  0 - 1 %    Basophils Absolute 0.0  0.0 - 0.1 K/uL   ANGIOTENSIN CONVERTING ENZYME     Status: Normal   Collection Time   06/23/12  9:23 PM      Component Value Range Comment   Angiotensin-Converting Enzyme 28  8 - 52 U/L   SEDIMENTATION RATE     Status: Abnormal   Collection Time   06/23/12  9:23 PM      Component Value Range Comment   Sed Rate 30 (*) 0 - 22 mm/hr   C-REACTIVE PROTEIN     Status: Abnormal   Collection Time   06/23/12  9:23 PM      Component Value Range Comment   CRP 0.14 (*) <0.60 mg/dL   COMPREHENSIVE METABOLIC PANEL     Status: Abnormal   Collection Time   06/24/12  6:05 AM      Component Value Range Comment   Sodium 140  135 - 145 mEq/L    Potassium 4.0  3.5 - 5.1 mEq/L    Chloride 105  96 - 112 mEq/L    CO2 22  19 - 32 mEq/L    Glucose, Bld 137 (*) 70 - 99 mg/dL    BUN 7  6 - 23 mg/dL    Creatinine, Ser 2.13  0.50 - 1.10 mg/dL    Calcium 8.9  8.4 - 08.6 mg/dL    Total Protein 6.9  6.0 - 8.3 g/dL    Albumin 3.8  3.5 - 5.2 g/dL    AST 12  0 - 37 U/L    ALT 10  0 - 35 U/L    Alkaline Phosphatase 56  39 - 117 U/L    Total Bilirubin 0.2 (*) 0.3 - 1.2 mg/dL    GFR calc non Af Amer >90  >90 mL/min    GFR calc Af Amer >90  >90 mL/min   CBC     Status: Abnormal   Collection Time   06/24/12  6:05 AM      Component Value Range Comment   WBC 11.3 (*) 4.0 - 10.5 K/uL    RBC 3.92  3.87 - 5.11 MIL/uL    Hemoglobin 12.4  12.0 - 15.0 g/dL    HCT 45.4  09.8 - 11.9 %    MCV 96.2  78.0 - 100.0 fL    MCH 31.6  26.0 - 34.0 pg    MCHC 32.9  30.0 - 36.0 g/dL    RDW 14.7  82.9 - 56.2 %    Platelets 262  150 - 400 K/uL    No results found for this basename: sdes, specrequest, cult, reptstatus   Dg Chest 2 View  06/23/2012  *RADIOLOGY REPORT*  Clinical Data: Difficulty breathing, face neck and throat swelling, allergic reaction  CHEST - 2 VIEW  Comparison: None.  Findings: The lungs are clear.  Mediastinal contours appear normal. The heart is within  normal limits in size.  No bony abnormality is seen.  IMPRESSION: No active lung disease.  Original Report Authenticated By: Juline Patch, M.D.     No results found for this or any previous visit (from the past 720 hour(s)).   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  1) Rash with angioedema: Appears to be do to a severe reaction to cephalexin. Other possibilities that could be considered would be acquired or hereditary angioedema.  Certainly given her history of heavy menorrhagia one would consider the possibility of toxic shock syndrome. However her angioedema symptoms are not classic for this. She also was using insert of tampons but rather pads reducing the likelihood of toxic shock. I havenearly zero suspicion for an acute tickborne illness --Continue to treat her with systemic corticosteroids and antihistamines. I would work her up for possible urticaria angioedema Follow her closely clinically.  2) ? Cellulitis: The area and her right foot and ankle seems to have responded to therapy and I doubt is really an active infection right now. I don't think her painful erythematous rash represents an infection though as mentioned above there is a slightly possibility of toxic shock. Don't think she needs further vancomycin. I think having her on some oral doxycycline for about a week is not unreasonable and it may possibly help with her exfoliative dermatitis  3) Exfoliative dermatitis of foot: not clear what etiology is. If this is a Staph driven process doxy could potentially help with this. It looks more classic for fungal infection  4) Menorrhagia: she needs effective control of this though she has had bad allergies and intolerances of various contraceptives and  5) tic exposure:: I  have no suspicion of Borrelia burgdorferi I (Lyme) and essentially very low suspicion for Ehrlichiosis and RMSF. The titers drawn yesterday (acutely) will be unlikely to be very revealing but I can follow them. They could be repeated in 2-4 weeks for convalescent titers. In any case she will be on doxycycline which would be  active against all 3 of these organisms  6) Screening: check HIV ab test per CDC recs.  Dr. Orvan Falconer will be back tomorrow.   Thank you so much for this interesting consult,   Acey Lav 06/24/2012, 10:52 AM   [phone] (pager) [phone] (office)

## 2012-06-24 NOTE — Care Management Note (Signed)
    Page 1 of 1   06/25/2012     4:10:55 PM   CARE MANAGEMENT NOTE 06/25/2012  Patient:  Linda Tyler, Linda Tyler   Account Number:  [id number]  Date Initiated:  06/24/2012  Documentation initiated by:  Letha Cape  Subjective/Objective Assessment:   dx cellulitis, angioedema  admit- lives with spouse.  pta independent.     Action/Plan:   Anticipated DC Date:  06/25/2012   Anticipated DC Plan:  HOME/SELF CARE      DC Planning Services  CM consult  Medication Assistance      Choice offered to / List presented to:             Status of service:  Completed, signed off Medicare Important Message given?   (If response is "NO", the following Medicare IM given date fields will be blank) Date Medicare IM given:   Date Additional Medicare IM given:    Discharge Disposition:  HOME/SELF CARE  Per UR Regulation:  Reviewed for med. necessity/level of care/duration of stay  If discussed at Long Length of Stay Meetings, dates discussed:    Comments:  06/25/12 16:09 Letha Cape RN, BSN [phone] patient for dc today, assisted patient with medications, pharmacy will call [phone] when meds are ready.  06/24/12 15:31 Letha Cape RN, BSN  [phone] patient lives with spouse, pta independent.  Patient is eligible for med ast if needed.  Patient has transportation at dc.  NCM will continue to follow for dc needs.

## 2012-06-25 DIAGNOSIS — R509 Fever, unspecified: Secondary | ICD-10-CM

## 2012-06-25 DIAGNOSIS — R21 Rash and other nonspecific skin eruption: Secondary | ICD-10-CM

## 2012-06-25 DIAGNOSIS — R072 Precordial pain: Secondary | ICD-10-CM

## 2012-06-25 HISTORY — PX: TRANSTHORACIC ECHOCARDIOGRAM: SHX275

## 2012-06-25 MED ORDER — DOXYCYCLINE HYCLATE 100 MG PO TABS: 100.0000 mg | ORAL_TABLET | Freq: Two times a day (BID) | ORAL | Status: AC

## 2012-06-25 MED ORDER — DOXYCYCLINE HYCLATE 100 MG PO TABS: 100.0000 mg | ORAL_TABLET | Freq: Two times a day (BID) | ORAL | Status: DC

## 2012-06-25 MED ORDER — FAMOTIDINE 20 MG PO TABS: 20.0000 mg | ORAL_TABLET | Freq: Two times a day (BID) | ORAL | Status: DC

## 2012-06-25 MED ORDER — PREDNISONE 10 MG PO TABS: ORAL_TABLET | ORAL | Status: AC

## 2012-06-25 MED ORDER — PREDNISONE 10 MG PO TABS: ORAL_TABLET | ORAL | Status: DC

## 2012-06-25 MED ORDER — DIPHENHYDRAMINE HCL 25 MG PO CAPS
25.0000 mg | ORAL_CAPSULE | Freq: Once | ORAL | Status: AC
  Administered 2012-06-25: 25 mg via ORAL
  Filled 2012-06-25: qty 1

## 2012-06-25 MED ORDER — DIPHENHYDRAMINE HCL 25 MG PO CAPS: 25.0000 mg | ORAL_CAPSULE | Freq: Four times a day (QID) | ORAL | Status: DC | PRN

## 2012-06-25 NOTE — Progress Notes (Signed)
Patient discharge teaching given, including activity, diet, follow-up appoints, and medications. Patient verbalized understanding of all discharge instructions. IV access was d/c'd. Vitals are stable. Skin is intact except as charted in most recent assessments. Pt to be escorted out by NT, to be driven home by family. 

## 2012-06-25 NOTE — Discharge Summary (Signed)
Physician Discharge Summary  Linda Tyler:[id number] DOB: 1981/07/04 DOA: 06/23/2012  PCP: Linda Puffer, MD  Admit date: 06/23/2012 Discharge date: 06/25/2012  Recommendations for Outpatient Follow-up:  1. Followup with primary care physician in 3-4 days from hospital discharge. 2. Consider outpatient repeat 2-D echo in 3 months versus Cardiology consultation as outpatient, for mildly reduced LVEF.  Discharge Diagnoses:  Principal Problem:  *Skin rash Active Problems:  Allergic drug rash  Cellulitis  Tick bite  Syncope and collapse  Chest pain on breathing  Dehydration  Hypokalemia  1. Allergic skin rash with angioedema: Possibly do to cephalexin. Patient has significant history of allergies to insect bites including hornets, bees and ticks. [place] She claims that once a year she gets an EpiPen shot for significant reactions. She has been unable to source EpiPen shot secondary to cost and lack of insurance but is trying. She was recently treated for right foot cellulitis with cephalexin and a few days later developed new rashes on the body associated with facial swelling, pruritus and even developed some tongue swelling and difficulty breathing on night prior to admission. She was initially seen at the PCPs office and received EpiPen shot and returned home only to come back to the ED with persisting symptoms. She was treated and discharged home and returned again. She was admitted to the hospital. She was treated with oral prednisone, Pepcid and when necessary Benadryl. With these measures she has improved. Her facial swelling has almost resolved. She has no tongue swelling or difficulty breathing or swallowing. The skin rashes or gradually fading. She will be discharged on steroid taper, oral Pepcid and when necessary Benadryl. She is advised to avoid agents that precipitate such allergic reactions, obviously including Keflex  and insect bites. She is also advised to  attempt to get EpiPen to keep with her at home through her PCP. She verbalized understanding. 2. Right foot cellulitis: Seems to have improved/resolved. ID consulted. They recommend completing a week's course of oral doxycycline to cover for this and for tick exposure. Cellulitis seems to have clinically resolved.   3. Hypokalemia: Repleted. 4. Syncope: Possibly secondary to hypotension from allergic reaction and mild dehydration. Patient was briefly hydrated. She denies any dizziness or lightheadedness. Orthostatic blood pressure checks negative. 5. Dehydration: Resolved.  6. Chest pain: Secondary to painful skin rash. Resolved. 7. Mild leukocytosis: Improved. 8. Exfoliative dermatitis of right foot: Unclear etiology. Outpatient followup and may benefit from dermatology consultation. 9. History of tick exposure. Low index of suspicion for RMSF. Complete a week's course of oral doxycycline. 10. Menorrhagia: Outpatient evaluation per PCP. 11. Mildly reduced LV function on 2-D echo. Discussed with reading cardiologist Dr. Kristeen Miss with [facility] who indicates that her LV systolic function is mildly reduced but this is not the reason for her syncope. He recommends outpatient followup with repeat echo in 3 months versus outpatient cardiology consultation. He will be willing to see her in outpatient consultation. Will defer decision to primary care physician.  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   Procedures:  None  Consultations:  Infectious disease  Discharge Exam:  Complaints: Patient indicates that she feels significantly better. No difficulty breathing or chest pain. Facial swelling has almost completely resolved. Rashes on other parts of the body have improved. Still has pruritus but is decreasing. No dizziness or lightheadedness. Requesting to go home.  Physical exam: Please note that patient was examined each day with a female nursing chaperone in the room during the examinations. Filed Vitals:   06/25/12 1400  BP: 114/77  Pulse: 95  Temp: 98.1 F (36.7 C)  Resp: 18   Filed Vitals:   06/24/12 1357 06/24/12 2127 06/25/12 0513 06/25/12 1400  BP: 104/75 109/73 99/62 114/77  Pulse: 83 80 83 95  Temp: 98.6 F (37 C) 98.1 F (36.7 C) 97.5 F (36.4 C) 98.1 F (36.7 C)  TempSrc: Oral Oral Oral Oral  Resp: 20 20 20 18   Height:      Weight:      SpO2: 98% 96% 98% 98%   General appearance: Comfortable.  Resp: Clear to auscultation. No increased work of breathing.  Cardio: First and second heart sounds heard, regular rate and rythm. No murmurs or JVD or gallop or pedal edema. Telemetry shows sinus rhythm.  GI: Non distended. Soft and nontender. No organomegaly or masses appreciated. Normal bowel sounds heard. umbilical piercing.  Extremities: symmetric 5/5 power.  Skin: Decreasing patchy erythema of the dorsum of right foot, left forearm and anterior chest. Facial erythema has resolved but still has mild swelling. Neurologic: Alert and oriented. No focal neurological deficits  Discharge Instructions  Discharge Orders    Future Orders Please Complete By Expires   Diet general      Increase activity slowly      Call MD for:      Comments:   For recurrent/persistent skin rash, pruritus, facial swelling.   Call MD for:  redness, tenderness, or signs of infection (pain, swelling, redness, odor or green/yellow discharge around incision site)      Call MD  for:  hives      Call MD for:  difficulty breathing, headache or visual disturbances      Call MD for:  persistant dizziness or light-headedness        Medication List  As of 06/25/2012  3:54 PM   STOP taking these medications         cephALEXin 500 MG capsule      fluconazole 150 MG tablet      ibuprofen 200 MG tablet         TAKE these medications         diphenhydrAMINE 25 mg capsule   Commonly known as: BENADRYL   Take 1 capsule (25 mg total) by mouth every 6 (six) hours as needed for itching or allergies.      doxycycline 100 MG tablet   Commonly known as: VIBRA-TABS   Take 1 tablet (100 mg total) by mouth every 12 (twelve) hours.      famotidine 20 MG tablet   Commonly known as: PEPCID   Take 1 tablet (20 mg total) by mouth 2 (two) times daily.      predniSONE 10 MG tablet   Commonly known as: DELTASONE   Take 4 tablets daily for 3 days, then 3 tablets daily for 3 days, then 2 tablets daily for 3 days, then 1 tablet daily for 3 days, then stop.           Follow-up Information    Follow up with Linda Puffer, MD. Schedule an appointment as  soon as possible for a visit in 3 days.   Contact information:   [address] [place] [address] [phone]           The results of significant diagnostics from this hospitalization (including imaging, microbiology, ancillary and laboratory) are listed below for reference.    Significant Diagnostic Studies: Dg Chest 2 View  06/23/2012  *RADIOLOGY REPORT*  Clinical Data: Difficulty breathing, face neck and throat swelling, allergic reaction  CHEST - 2 VIEW  Comparison: None.  Findings: The lungs are clear.  Mediastinal contours appear normal. The heart is within normal limits in size.  No bony abnormality is seen.  IMPRESSION: No active lung disease.  Original Report Authenticated By: Juline Patch, M.D.   2-D echo:  Study Conclusions  - Left ventricle: The cavity size was normal. Wall thickness was  normal. Systolic function was mildly reduced. The estimated ejection fraction was in the range of 45% to 50%. - Right ventricle: The cavity size was moderately decreased. Wall thickness was normal.    Microbiology: No results found for this or any previous visit (from the past 240 hour(s)).   Labs: Basic Metabolic Panel:  Lab 06/24/12 9147 06/23/12 1554  NA 140 140  K 4.0 3.4*  CL 105 107  CO2 22 20  GLUCOSE 137* 145*  BUN 7 6  CREATININE 0.51 0.60  CALCIUM 8.9 9.0  MG -- --  PHOS -- --   Liver Function Tests:  Lab 06/24/12 0605  AST 12  ALT 10  ALKPHOS 56  BILITOT 0.2*  PROT 6.9  ALBUMIN 3.8   No results found for this basename: LIPASE:5,AMYLASE:5 in the last 168 hours No results found for this basename: AMMONIA:5 in the last 168 hours CBC:  Lab 06/24/12 0605 06/23/12 1554  WBC 11.3* 15.2*  NEUTROABS -- 13.3*  HGB 12.4 12.8  HCT 37.7 37.9  MCV 96.2 94.5  PLT 262 278   Cardiac Enzymes: No results found for this basename: CKTOTAL:5,CKMB:5,CKMBINDEX:5,TROPONINI:5 in the last 168 hours BNP: BNP (last 3 results) No results found for this basename: PROBNP:3 in the last 8760 hours CBG: No results found for this basename: GLUCAP:5 in the last 168 hours  Other lab data:  1. CRP: 0.14. 2. ESR: 30. 3. Urine pregnancy test: Negative 4. ANA: Negative 5. ACE: 28 6. ANCA screen : Negative 7. HIV screen: Nonreactive. 8. RMSF IgG: 2.76. 9. RMSF IgM: 0.32. 10. UA: Not indicative of UTI. No proteinuria.  Time coordinating discharge: 35 minutes  Signed:  Brita Jurgensen  [facility] 06/25/2012, 3:54 PM

## 2012-06-25 NOTE — Progress Notes (Signed)
Patient ID: Linda Tyler, female   DOB: 08-Nov-1981, 31 y.o.   MRN: [id number]    [facility]    Date of Admission:  06/23/2012           Day 3 doxycycline Principal Problem:  *Skin rash Active Problems:  Allergic drug rash  Cellulitis  Tick bite  Syncope and collapse  Chest pain on breathing  Dehydration  Hypokalemia      . doxycycline  100 mg Oral Q12H  . enoxaparin (LOVENOX) injection  40 mg Subcutaneous Q24H  . famotidine  20 mg Oral BID  . predniSONE  50 mg Oral Q breakfast  . sodium chloride  3 mL Intravenous Q12H  . DISCONTD: famotidine (PEPCID) IV  20 mg Intravenous Q12H    Subjective: She is feeling much better. She states that the swelling of her face has improved greatly and her itching is better. She that are bothering her.still has some areas, particularly where she has had tape applied  Objective: Temp:  [97.5 F (36.4 C)-98.6 F (37 C)] 97.5 F (36.4 C) (07/12 0513) Pulse Rate:  [80-83] 83  (07/12 0513) Resp:  [20] 20  (07/12 0513) BP: (99-109)/(62-75) 99/62 mmHg (07/12 0513) SpO2:  [96 %-98 %] 98 % (07/12 0513)  General: Alert and comfortable eating lunch Skin: Diffuse, splotchy erythema fading. Some superficial desquamation on her face  Assessment:  I agree that this appears to be a hypersensitivity reaction, possibly due to the cephalexin she was on earlier this week. She seems to be improving. She is still on doxycycline because of recent significant tick exposure and I agree with empiric continuation of that for 4 more days.   Plan: 1. Doxycycline for 4 more days 2. Okay for discharge home from my standpoint. Please call if we can be of further assistance.  Cliffton Asters, MD [facility] [phone] pager   [phone] cell 06/25/2012, 1:12 PM

## 2012-06-25 NOTE — Progress Notes (Signed)
  Echocardiogram 2D Echocardiogram has been performed.  Linda Tyler 06/25/2012, 11:50 AM

## 2012-09-17 ENCOUNTER — Emergency Department (HOSPITAL_COMMUNITY): Payer: Self-pay

## 2012-09-17 ENCOUNTER — Emergency Department (HOSPITAL_COMMUNITY)
Admission: EM | Admit: 2012-09-17 | Discharge: 2012-09-17 | Disposition: A | Discharge status: Home / Self Care | Payer: Self-pay | Attending: Emergency Medicine | Admitting: Emergency Medicine

## 2012-09-17 DIAGNOSIS — R002 Palpitations: Secondary | ICD-10-CM | POA: Insufficient documentation

## 2012-09-17 DIAGNOSIS — R079 Chest pain, unspecified: Secondary | ICD-10-CM | POA: Insufficient documentation

## 2012-09-17 DIAGNOSIS — Z79899 Other long term (current) drug therapy: Secondary | ICD-10-CM | POA: Insufficient documentation

## 2012-09-17 DIAGNOSIS — F17200 Nicotine dependence, unspecified, uncomplicated: Secondary | ICD-10-CM | POA: Insufficient documentation

## 2012-09-17 DIAGNOSIS — R51 Headache: Secondary | ICD-10-CM | POA: Insufficient documentation

## 2012-09-17 LAB — BASIC METABOLIC PANEL
BUN: 6 mg/dL (ref 6–23)
Calcium: 9 mg/dL (ref 8.4–10.5)
Chloride: 103 mEq/L (ref 96–112)
Creatinine, Ser: 0.5 mg/dL (ref 0.50–1.10)
GFR calc Af Amer: >90 mL/min (ref >90)
GFR calc non Af Amer: >90 mL/min (ref >90)
Glucose, Bld: 98 mg/dL (ref 70–99)
Sodium: 136 mEq/L (ref 135–145)

## 2012-09-17 LAB — CBC
MCH: 32.4 pg (ref 26.0–34.0)
MCHC: 33.8 g/dL (ref 30.0–36.0)
Platelets: 199 10*3/uL (ref 150–400)
RBC: 3.74 MIL/uL — ABNORMAL LOW (ref 3.87–5.11)
RDW: 12.7 % (ref 11.5–15.5)
WBC: 7.7 10*3/uL (ref 4.0–10.5)

## 2012-09-17 MED ORDER — ONDANSETRON 8 MG PO TBDP: 8.0000 mg | ORAL_TABLET | Freq: Three times a day (TID) | ORAL | Status: DC | PRN

## 2012-09-17 MED ORDER — HYDROCODONE-ACETAMINOPHEN 5-500 MG PO TABS: 1.0000 | ORAL_TABLET | Freq: Four times a day (QID) | ORAL | Status: DC | PRN

## 2012-09-17 MED ORDER — MORPHINE SULFATE 4 MG/ML IJ SOLN
4.0000 mg | Freq: Once | INTRAMUSCULAR | Status: AC
  Administered 2012-09-17: 4 mg via INTRAVENOUS
  Filled 2012-09-17: qty 1

## 2012-09-17 MED ORDER — KETOROLAC TROMETHAMINE 30 MG/ML IJ SOLN
30.0000 mg | Freq: Once | INTRAMUSCULAR | Status: AC
  Administered 2012-09-17: 30 mg via INTRAVENOUS
  Filled 2012-09-17: qty 1

## 2012-09-17 MED ORDER — ONDANSETRON 4 MG PO TBDP
ORAL_TABLET | ORAL | Status: AC
  Filled 2012-09-17: qty 1

## 2012-09-17 MED ORDER — OXYCODONE-ACETAMINOPHEN 5-325 MG PO TABS
1.0000 | ORAL_TABLET | Freq: Once | ORAL | Status: AC
  Administered 2012-09-17: 1 via ORAL
  Filled 2012-09-17: qty 1

## 2012-09-17 NOTE — ED Notes (Signed)
Pt husband is coming to get patient

## 2012-09-17 NOTE — ED Notes (Signed)
Patient transported to X-ray 

## 2012-09-17 NOTE — ED Notes (Signed)
Pt had Zofran 4mg  and Ntg x2 by EMS pta

## 2012-09-17 NOTE — ED Notes (Signed)
Pt has chronic chest pain and started having chest pain while driving

## 2012-09-17 NOTE — ED Provider Notes (Signed)
History     CSN: [id number]  Arrival date & time 09/17/12  4098   First MD Initiated Contact with Patient 09/17/12 [phone]      Chief Complaint  Patient presents with  . Chest Pain    (Consider location/radiation/quality/duration/timing/severity/associated sxs/prior treatment) The history is provided by the patient.   patient reports developing chest pain while driving her child to school today.  She reports she's had a long-standing history of this chest pain.  She also reports palpitations and that her heart rate may benefit in the one teens.  She was recently placed on propranolol for palpitations.  She was instructed to followup with her cardiologist as an outpatient but she never followed up.  His reported by the patient her ejection fraction is 45% on echocardiogram and that she was supposed to followup at the cardiologist but never did.  She reports her discomfort is a pressure aching in her chest.  She has no shortness of breath at this time.  She did become nauseated and vomited several times.  She also reports a headache coming on at this time with a long-standing history of migraine-like headaches.  She reports she has photophobia.  She denies weakness of her arms or legs.  She has no recent cough or congestion.  She denies fevers or chills.  She has no history of DVT or pulmonary embolism.  Past Medical History  Diagnosis Date  . Cellulitis   . Skin rash     Past Surgical History  Procedure Date  . Dental extra     Family History  Problem Relation Age of Onset  . Diabetes Mother     History  Substance Use Topics  . Smoking status: Current Every Day Smoker -- 0.5 packs/day  . Smokeless tobacco: Not on file  . Alcohol Use: No    OB History    Grav Para Term Preterm Abortions TAB SAB Ect Mult Living                  Review of Systems  Cardiovascular: Positive for chest pain.  All other systems reviewed and are negative.    Allergies  Cephalexin; Sulfa  antibiotics; and Aspirin  Home Medications   Current Outpatient Rx  Name Route Sig Dispense Refill  . NORETHIN-ETH ESTRAD TRIPHASIC 0.5/0.75/1-35 MG-MCG PO TABS Oral Take 1 tablet by mouth daily.    Marland Kitchen PROPRANOLOL HCL 40 MG PO TABS Oral Take 40 mg by mouth 3 (three) times daily.      BP 106/78  Pulse 92  Temp 98.3 F (36.8 C) (Oral)  Resp 20  SpO2 97%  LMP 09/03/2012  Physical Exam  Nursing note and vitals reviewed. Constitutional: She is oriented to person, place, and time. She appears well-developed and well-nourished. No distress.  HENT:  Head: Normocephalic and atraumatic.  Eyes: EOM are normal. Pupils are equal, round, and reactive to light.  Neck: Normal range of motion.  Cardiovascular: Normal rate, regular rhythm and normal heart sounds.   Pulmonary/Chest: Effort normal and breath sounds normal.  Abdominal: Soft. She exhibits no distension. There is no tenderness.  Musculoskeletal: Normal range of motion.  Neurological: She is alert and oriented to person, place, and time.       5/5 strength in major muscle groups of  bilateral upper and lower extremities. Speech normal. No facial asymetry.   Skin: Skin is warm and dry.  Psychiatric: She has a normal mood and affect. Judgment normal.    ED Course  Procedures (including critical care time)  Labs Reviewed  CBC - Abnormal; Notable for the following:    RBC 3.74 (*)     HCT 35.8 (*)     All other components within normal limits  BASIC METABOLIC PANEL   Dg Chest 2 View  09/17/2012  *RADIOLOGY REPORT*  Clinical Data: Chest pain  CHEST - 2 VIEW  Comparison: June 23, 2012  Findings:  Lungs clear.  Heart size and pulmonary vascularity are normal.  No adenopathy.  No bone lesions.  IMPRESSION: Lungs clear.   Original Report Authenticated By: Arvin Collard. WOODRUFF III, M.D.     I personally reviewed the imaging tests through PACS system  I reviewed available ER/hospitalization records thought the EMR   1. Chest pain     2. Headache   3. Palpitations       MDM  The patient's had some improvement in her headache.  She be discharged with Vicodin for her headache is her headache has been sounds like a migraine.  Her EKG is normal sinus rhythm.  Her chest pain is reproducible on exam this likely musculoskeletal.  She is still requesting followup with the cardiologist and therefore she will be referred to a cardiologist as an outpatient.  Home with pain medicine and antinausea medicine.  PCP followup.  She understands return the emergency department for new or worsening symptoms.  Medical screening examination completed I doubt there is any life-threatening emergency this time.        Lyanne Co, MD 09/17/12 1147

## 2013-10-18 IMAGING — CR DG CHEST 2V
2 series · 2 of 2 positions shown · non-contrast
Comparison: June 23, 2012

CLINICAL DATA: Chest pain

CHEST - 2 VIEW

[w chest pa]
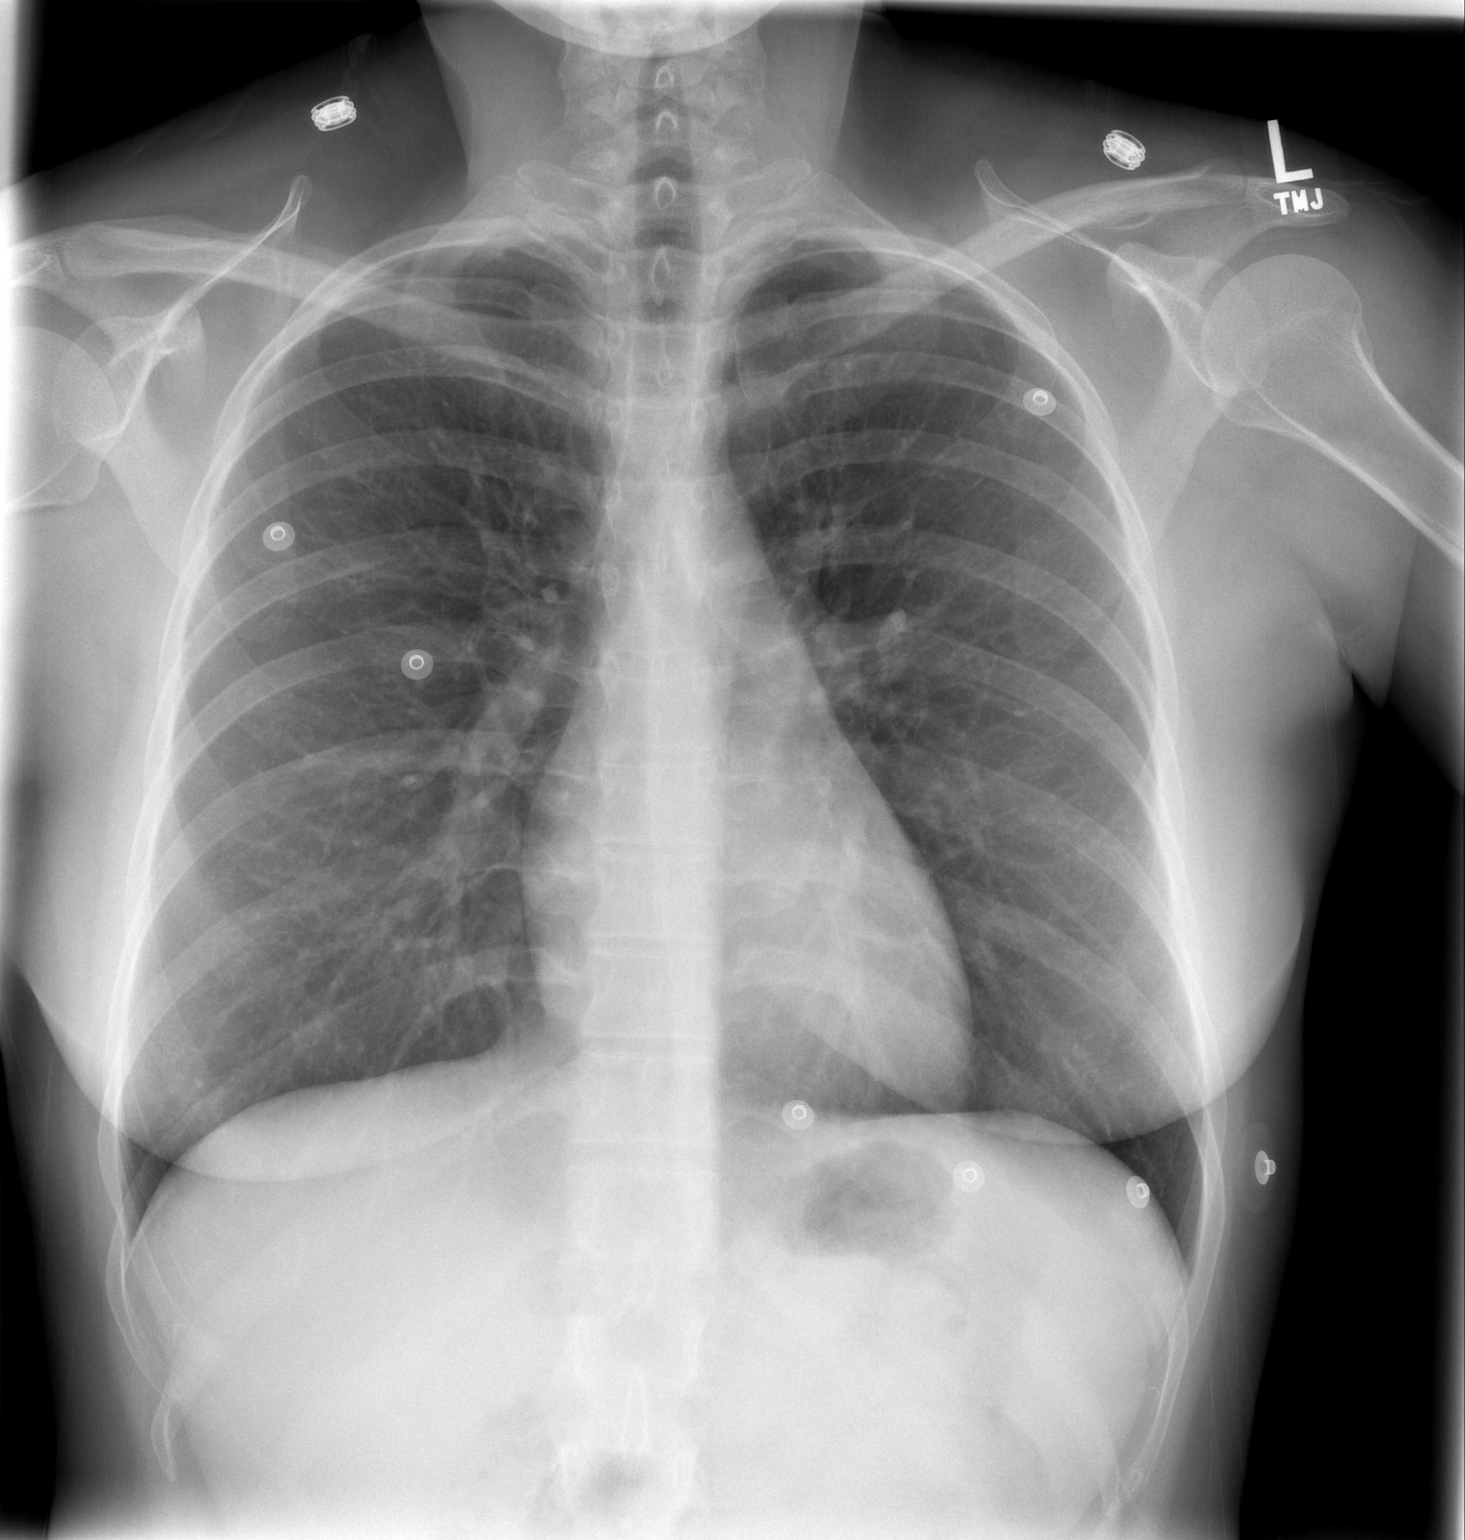

[w chest lat]
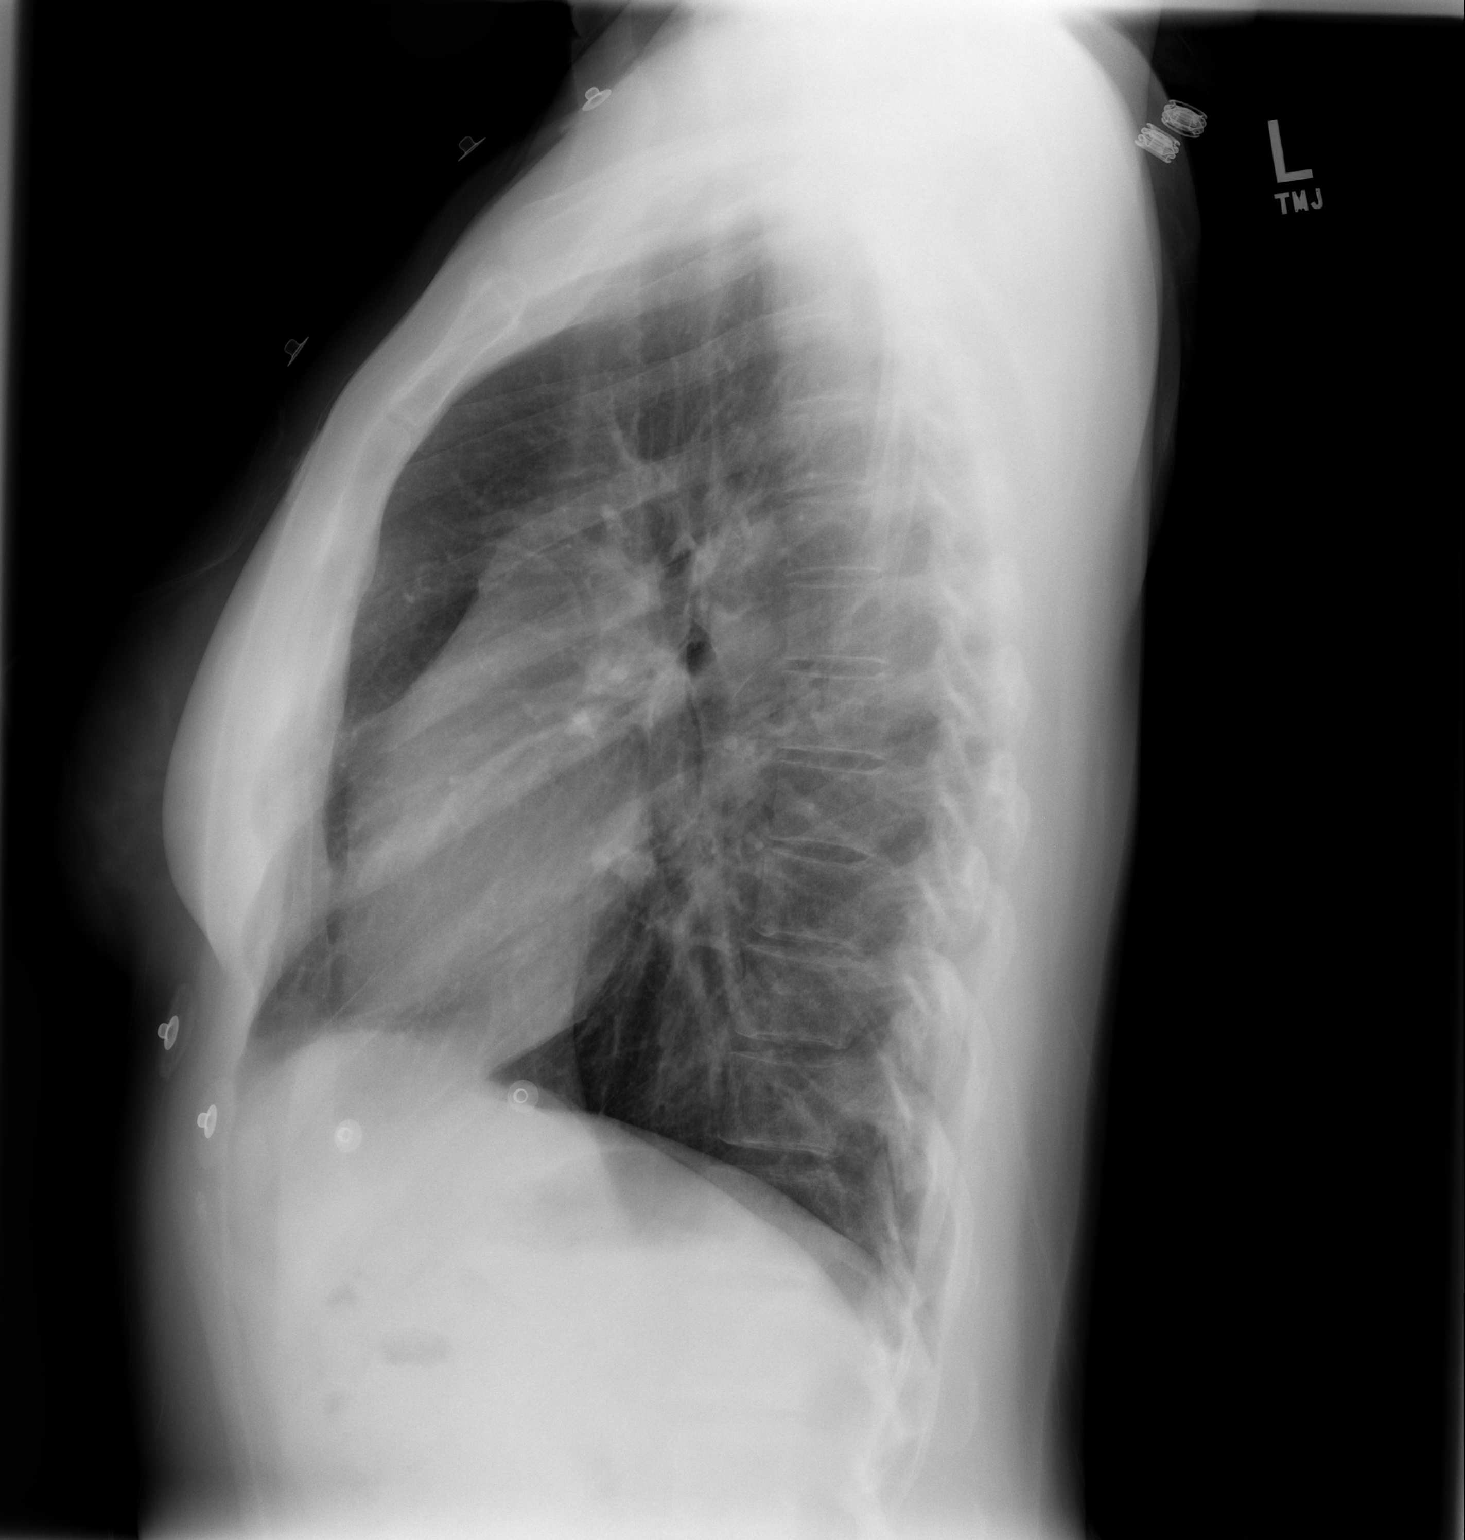

[2 of 2 positions shown; findings below may reference images not displayed]

FINDINGS: Lungs clear.  Heart size and pulmonary vascularity are
normal.  No adenopathy.  No bone lesions.
IMPRESSION: Lungs clear.

## 2014-07-24 ENCOUNTER — Ambulatory Visit (INDEPENDENT_AMBULATORY_CARE_PROVIDER_SITE_OTHER): Payer: BC Managed Care – PPO | Admitting: Cardiovascular Disease

## 2014-07-24 ENCOUNTER — Encounter: Payer: Self-pay | Admitting: Cardiovascular Disease

## 2014-07-24 VITALS — BP 108/78 | HR 75 | Ht 67.0 in | Wt 159.0 lb

## 2014-07-24 DIAGNOSIS — I519 Heart disease, unspecified: Secondary | ICD-10-CM

## 2014-07-24 NOTE — Patient Instructions (Signed)
  We will see you back in follow up as needed.   Dr Berry has ordered:  Echocardiogram. Echocardiography is a painless test that uses sound waves to create images of your heart. It provides your doctor with information about the size and shape of your heart and how well your heart's chambers and valves are working. This procedure takes approximately one hour. There are no restrictions for this procedure.      

## 2014-07-24 NOTE — Progress Notes (Signed)
     07/24/2014 Max Fickleonya Starliper   [birth year]  [id number]  Primary Physician Aida PufferLITTLE,JAMES, MD Primary Cardiologist: Runell GessJonathan J. Berry MD Roseanne RenoFACP,FACC,FAHA, FSCAI   HPI:  Ms. Linda Tyler is a 33 year old thin appearing married Caucasian female mother of one child who is referred by Mauricio Poegina [place] nurse practitioner [facility] for evaluation of left ventricular dysfunction. Her cardiac risk factors notable for continued tobacco use of one half pack per day but otherwise she has no other risk factors. She's never had a heart attack or stroke. She does have atypical chest pain. She does have a tick bite in the past and an infection as a result of this as [facility] fever. A 2-D echo performed several years ago revealed an ejection fraction of 45-50%.   No current outpatient prescriptions on file.   No current facility-administered medications for this visit.    Allergies  Allergen Reactions  . Cephalexin Anaphylaxis  . Sulfa Antibiotics Anaphylaxis and Hives  . Penicillins   . Aspirin Swelling and Rash    History   Social History  . Marital Status: Married    Spouse Name: N/A    Number of Children: N/A  . Years of Education: N/A   Occupational History  . Not on file.   Social History Main Topics  . Smoking status: Current Every Day Smoker -- 0.50 packs/day  . Smokeless tobacco: Not on file  . Alcohol Use: No  . Drug Use: No  . Sexual Activity:    Other Topics Concern  . Not on file   Social History Narrative  . No narrative on file     Review of Systems: General: negative for chills, fever, night sweats or weight changes.  Cardiovascular: negative for chest pain, dyspnea on exertion, edema, orthopnea, palpitations, paroxysmal nocturnal dyspnea or shortness of breath Dermatological: negative for rash Respiratory: negative for cough or wheezing Urologic: negative for hematuria Abdominal: negative for nausea, vomiting, diarrhea, bright red blood per  rectum, melena, or hematemesis Neurologic: negative for visual changes, syncope, or dizziness All other systems reviewed and are otherwise negative except as noted above.    Blood pressure 108/78, pulse 75, height 5\' 7"  (1.702 m), weight 159 lb (72.122 kg).  General appearance: alert and no distress Neck: no adenopathy, no carotid bruit, no JVD, supple, symmetrical, trachea midline and thyroid not enlarged, symmetric, no tenderness/mass/nodules Lungs: clear to auscultation bilaterally Heart: regular rate and rhythm, S1, S2 normal, no murmur, click, rub or gallop Extremities: extremities normal, atraumatic, no cyanosis or edema  EKG normal sinus rhythm at 75 without ST or T wave changes  ASSESSMENT AND PLAN:   LV dysfunction Patient has a history of mild LV dysfunction in the past by 2-D echocardiogram performed 06/25/12 with an EF of 45-50%. The right ventricle was moderately decreased in size at that time. I'm going to repeat a 2D echocardiogram      Runell GessJonathan J. Berry MD [facility] BrooklynFACP,FACC,FAHA, [facility] Center[facility]SCAI 07/24/2014 12:00 PM

## 2014-07-24 NOTE — Assessment & Plan Note (Signed)
Patient has a history of mild LV dysfunction in the past by 2-D echocardiogram performed 06/25/12 with an EF of 45-50%. The right ventricle was moderately decreased in size at that time. I'm going to repeat a 2D echocardiogram

## 2014-07-25 ENCOUNTER — Encounter: Payer: Self-pay | Admitting: Cardiovascular Disease

## 2014-07-31 ENCOUNTER — Ambulatory Visit (HOSPITAL_COMMUNITY)
Admission: RE | Admit: 2014-07-31 | Discharge: 2014-07-31 | Disposition: A | Discharge status: Home / Self Care | Payer: BC Managed Care – PPO | Source: Ambulatory Visit | Attending: Cardiology | Admitting: Cardiology

## 2014-07-31 DIAGNOSIS — I519 Heart disease, unspecified: Secondary | ICD-10-CM | POA: Insufficient documentation

## 2014-07-31 DIAGNOSIS — I379 Nonrheumatic pulmonary valve disorder, unspecified: Secondary | ICD-10-CM

## 2014-07-31 NOTE — Progress Notes (Signed)
2D Echo Performed 07/31/2014    Shiana Rappleye, RCS  

## 2014-12-15 HISTORY — PX: OTHER SURGICAL HISTORY: SHX169

## 2015-06-06 ENCOUNTER — Encounter: Payer: Self-pay | Admitting: *Deleted

## 2015-07-06 ENCOUNTER — Encounter: Payer: Self-pay | Admitting: Cardiovascular Disease

## 2016-10-15 DIAGNOSIS — K581 Irritable bowel syndrome with constipation: Secondary | ICD-10-CM

## 2016-10-15 HISTORY — DX: Irritable bowel syndrome with constipation: K58.1

## 2016-10-16 ENCOUNTER — Other Ambulatory Visit: Payer: Self-pay | Admitting: Gastroenterology

## 2016-10-16 ENCOUNTER — Ambulatory Visit
Admission: RE | Admit: 2016-10-16 | Discharge: 2016-10-16 | Disposition: A | Discharge status: Home / Self Care | Payer: BLUE CROSS/BLUE SHIELD | Source: Ambulatory Visit | Attending: Gastroenterology | Admitting: Gastroenterology

## 2016-10-16 DIAGNOSIS — R1084 Generalized abdominal pain: Secondary | ICD-10-CM

## 2016-10-16 MED ORDER — IOPAMIDOL (ISOVUE-300) INJECTION 61%
100.0000 mL | Freq: Once | INTRAVENOUS | Status: AC | PRN
  Administered 2016-10-16: 100 mL via INTRAVENOUS

## 2016-11-19 ENCOUNTER — Encounter (HOSPITAL_BASED_OUTPATIENT_CLINIC_OR_DEPARTMENT_OTHER): Payer: Self-pay | Admitting: *Deleted

## 2016-11-19 NOTE — Progress Notes (Signed)
[facility]Labs to be drawn 11/21/2016,T&S,urine pregnancy on arrival,Instructed Npo after Mn-will take wellbutrin,linzess am of procedure.

## 2016-11-21 DIAGNOSIS — Z880 Allergy status to penicillin: Secondary | ICD-10-CM | POA: Diagnosis not present

## 2016-11-21 DIAGNOSIS — Z87891 Personal history of nicotine dependence: Secondary | ICD-10-CM | POA: Diagnosis not present

## 2016-11-21 DIAGNOSIS — Z833 Family history of diabetes mellitus: Secondary | ICD-10-CM | POA: Diagnosis not present

## 2016-11-21 DIAGNOSIS — Z888 Allergy status to other drugs, medicaments and biological substances status: Secondary | ICD-10-CM | POA: Diagnosis not present

## 2016-11-21 DIAGNOSIS — Z8249 Family history of ischemic heart disease and other diseases of the circulatory system: Secondary | ICD-10-CM | POA: Diagnosis not present

## 2016-11-21 DIAGNOSIS — N841 Polyp of cervix uteri: Secondary | ICD-10-CM | POA: Diagnosis not present

## 2016-11-21 DIAGNOSIS — N946 Dysmenorrhea, unspecified: Secondary | ICD-10-CM | POA: Diagnosis not present

## 2016-11-21 DIAGNOSIS — Z882 Allergy status to sulfonamides status: Secondary | ICD-10-CM | POA: Diagnosis not present

## 2016-11-21 DIAGNOSIS — N920 Excessive and frequent menstruation with regular cycle: Secondary | ICD-10-CM | POA: Diagnosis not present

## 2016-11-21 DIAGNOSIS — N838 Other noninflammatory disorders of ovary, fallopian tube and broad ligament: Secondary | ICD-10-CM | POA: Diagnosis not present

## 2016-11-21 DIAGNOSIS — Z886 Allergy status to analgesic agent status: Secondary | ICD-10-CM | POA: Diagnosis not present

## 2016-11-21 LAB — COMPREHENSIVE METABOLIC PANEL
ALBUMIN: 4.2 g/dL (ref 3.5–5.0)
ALK PHOS: 50 U/L (ref 38–126)
ALT: 19 U/L (ref 14–54)
AST: 17 U/L (ref 15–41)
Anion gap: 5 (ref 5–15)
BUN: 15 mg/dL (ref 6–20)
CALCIUM: 8.8 mg/dL — AB (ref 8.9–10.3)
CO2: 28 mmol/L (ref 22–32)
Chloride: 105 mmol/L (ref 101–111)
Creatinine, Ser: 0.85 mg/dL (ref 0.44–1.00)
GFR calc non Af Amer: >60 mL/min (ref >60)
Glucose, Bld: 88 mg/dL (ref 65–99)
Potassium: 3.9 mmol/L (ref 3.5–5.1)
SODIUM: 138 mmol/L (ref 135–145)
Total Bilirubin: 0.5 mg/dL (ref 0.3–1.2)
Total Protein: 7.1 g/dL (ref 6.5–8.1)

## 2016-11-21 LAB — CBC
HCT: 41 % (ref 36.0–46.0)
HEMOGLOBIN: 13.5 g/dL (ref 12.0–15.0)
MCH: 32.9 pg (ref 26.0–34.0)
MCHC: 32.9 g/dL (ref 30.0–36.0)
MCV: 100 fL (ref 78.0–100.0)
PLATELETS: 254 10*3/uL (ref 150–400)
RBC: 4.1 MIL/uL (ref 3.87–5.11)
RDW: 13.2 % (ref 11.5–15.5)
WBC: 6.9 10*3/uL (ref 4.0–10.5)

## 2016-11-23 NOTE — Anesthesia Preprocedure Evaluation (Signed)
Anesthesia Evaluation  Patient identified by MRN, date of birth, ID band Patient awake    Reviewed: Allergy & Precautions, NPO status , Patient's Chart, lab work & pertinent test results  Airway Mallampati: II  TM Distance: >3 FB Neck ROM: Full    Dental no notable dental hx.    Pulmonary neg pulmonary ROS, former smoker,    Pulmonary exam normal breath sounds clear to auscultation       Cardiovascular negative cardio ROS Normal cardiovascular exam Rhythm:Regular Rate:Normal     Neuro/Psych negative neurological ROS  negative psych ROS   GI/Hepatic negative GI ROS, Neg liver ROS,   Endo/Other  negative endocrine ROS  Renal/GU negative Renal ROS     Musculoskeletal negative musculoskeletal ROS (+)   Abdominal   Peds  Hematology negative hematology ROS (+)   Anesthesia Other Findings   Reproductive/Obstetrics negative OB ROS                             Anesthesia Physical Anesthesia Plan  ASA: II  Anesthesia Plan: General   Post-op Pain Management:    Induction: Intravenous  Airway Management Planned: Oral ETT  Additional Equipment:   Intra-op Plan:   Post-operative Plan: Extubation in OR  Informed Consent: I have reviewed the patients History and Physical, chart, labs and discussed the procedure including the risks, benefits and alternatives for the proposed anesthesia with the patient or authorized representative who has indicated his/her understanding and acceptance.   Dental advisory given  Plan Discussed with: CRNA  Anesthesia Plan Comments:         Anesthesia Quick Evaluation

## 2016-11-23 NOTE — H&P (Signed)
Linda Tyler is an 35 y.o. female with heavy menstrual bleeding, dysmenorrhea, and left sided pelvic pain.  Linda Tyler has tried OCPs and NSAIDs with no improvement in her symptoms.  Pelvic ultrasound shows 90cc uterus with bilateral adnexal cyst; right measuring 1.2 cm and left measuring 3.5 cm.  The patient was referred to GI for evaluation and was diagnosed with constipation and given Linzess with good results.  Her pelvic pain has however not improved.  Linda Tyler desires definitive management.  Linda Tyler has completed child-bearing and is s/p BTL.  Pertinent Gynecological History: Menses: flow is excessive with use of 8 pads or tampons on heaviest days Bleeding: prolonged Contraception: tubal ligation DES exposure: unknown Blood transfusions: none Sexually transmitted diseases: no past history Previous GYN Procedures: BTL  Last mammogram: n/a Date: n/a Last pap: normal Date: 2015 OB History: G4, P3   Menstrual History: Menarche age: n/a Patient's last menstrual period was 11/13/2016.    Past Medical History:  Diagnosis Date  . Abnormal uterine bleeding (AUB)   . Cellulitis   . Irritable bowel syndrome with constipation 10/2016  . LV dysfunction    EF 45-50% by 2-D echo in the past  . Skin rash     Past Surgical History:  Procedure Laterality Date  . dental extra  2016  . TRANSTHORACIC ECHOCARDIOGRAM  06/25/2012   EF 45% TO 50%. RV- CAVITY SIZE MODERATELY DECREASED.  Marland Kitchen. TUBAL LIGATION  2007    Family History  Problem Relation Age of Onset  . Diabetes Mother   . Heart failure Mother   . Heart disease Mother     died in her 250s with a massive MI  . Heart failure Father   . Hypertension Father     Social History:  reports that Linda Tyler quit smoking about 3 years ago. Her smoking use included Cigarettes. Linda Tyler smoked 0.00 packs per day. Linda Tyler has never used smokeless tobacco. Linda Tyler reports that Linda Tyler does not drink alcohol or use drugs.  Allergies:  Allergies  Allergen Reactions  . Cephalexin  Anaphylaxis  . Sulfa Antibiotics Anaphylaxis and Hives  . Penicillins Nausea And Vomiting  . Aspirin Swelling and Rash    No prescriptions prior to admission.    ROS  Height 5\' 7"  (1.702 m), weight 157 lb (71.2 kg), last menstrual period 11/13/2016. Physical Exam  Constitutional: Linda Tyler is oriented to person, place, and time. Linda Tyler appears well-developed and well-nourished.  Cardiovascular: Normal rate and regular rhythm.   Respiratory: Effort normal and breath sounds normal.  GI: Soft. There is no rebound and no guarding.  Neurological: Linda Tyler is alert and oriented to person, place, and time.  Skin: Skin is warm and dry.  Psychiatric: Linda Tyler has a normal mood and affect. Her behavior is normal.    No results found for this or any previous visit (from the past 24 hour(s)).  No results found.  Assessment/Plan: 35yo G4P3 with HMB, dysmenorrhea, and left pelvic pain -LAVH, b/l salpingectomy, left oophorectomy -Patient is counseled re: risk of bleeding, infection, scarring and damage to surrounding structures.  Linda Tyler understands the possibility of needing to convert to abdominal procedure if unable to complete laparoscopically.  All questions were answered and the patient wishes to proceed.  Jasyah Theurer 11/23/2016, 5:19 PM

## 2016-11-24 ENCOUNTER — Encounter (HOSPITAL_BASED_OUTPATIENT_CLINIC_OR_DEPARTMENT_OTHER): Payer: Self-pay

## 2016-11-24 ENCOUNTER — Encounter (HOSPITAL_COMMUNITY)
Admission: RE | Disposition: A | Discharge status: Home / Self Care | Payer: Self-pay | Source: Ambulatory Visit | Attending: Obstetrics & Gynecology

## 2016-11-24 ENCOUNTER — Observation Stay (HOSPITAL_BASED_OUTPATIENT_CLINIC_OR_DEPARTMENT_OTHER)
Admission: RE | Admit: 2016-11-24 | Discharge: 2016-11-25 | Disposition: A | Discharge status: Home / Self Care | Payer: BLUE CROSS/BLUE SHIELD | Source: Ambulatory Visit | Attending: Obstetrics & Gynecology | Admitting: Obstetrics & Gynecology

## 2016-11-24 ENCOUNTER — Ambulatory Visit (HOSPITAL_BASED_OUTPATIENT_CLINIC_OR_DEPARTMENT_OTHER): Payer: BLUE CROSS/BLUE SHIELD | Admitting: Anesthesiology

## 2016-11-24 DIAGNOSIS — Z90710 Acquired absence of both cervix and uterus: Secondary | ICD-10-CM | POA: Diagnosis present

## 2016-11-24 DIAGNOSIS — Z880 Allergy status to penicillin: Secondary | ICD-10-CM | POA: Insufficient documentation

## 2016-11-24 DIAGNOSIS — N838 Other noninflammatory disorders of ovary, fallopian tube and broad ligament: Secondary | ICD-10-CM | POA: Insufficient documentation

## 2016-11-24 DIAGNOSIS — Z833 Family history of diabetes mellitus: Secondary | ICD-10-CM | POA: Insufficient documentation

## 2016-11-24 DIAGNOSIS — N841 Polyp of cervix uteri: Secondary | ICD-10-CM | POA: Insufficient documentation

## 2016-11-24 DIAGNOSIS — N920 Excessive and frequent menstruation with regular cycle: Secondary | ICD-10-CM | POA: Diagnosis not present

## 2016-11-24 DIAGNOSIS — Z888 Allergy status to other drugs, medicaments and biological substances status: Secondary | ICD-10-CM | POA: Insufficient documentation

## 2016-11-24 DIAGNOSIS — Z87891 Personal history of nicotine dependence: Secondary | ICD-10-CM | POA: Insufficient documentation

## 2016-11-24 DIAGNOSIS — Z882 Allergy status to sulfonamides status: Secondary | ICD-10-CM | POA: Insufficient documentation

## 2016-11-24 DIAGNOSIS — Z8249 Family history of ischemic heart disease and other diseases of the circulatory system: Secondary | ICD-10-CM | POA: Insufficient documentation

## 2016-11-24 DIAGNOSIS — Z886 Allergy status to analgesic agent status: Secondary | ICD-10-CM | POA: Insufficient documentation

## 2016-11-24 DIAGNOSIS — N946 Dysmenorrhea, unspecified: Secondary | ICD-10-CM | POA: Insufficient documentation

## 2016-11-24 HISTORY — DX: Irritable bowel syndrome with constipation: K58.1

## 2016-11-24 HISTORY — DX: Abnormal uterine and vaginal bleeding, unspecified: N93.9

## 2016-11-24 HISTORY — PX: LAPAROSCOPIC VAGINAL HYSTERECTOMY WITH SALPINGECTOMY: SHX6680

## 2016-11-24 LAB — ABO/RH: ABO/RH(D): O POS

## 2016-11-24 LAB — TYPE AND SCREEN
ABO/RH(D): O POS
ANTIBODY SCREEN: NEGATIVE

## 2016-11-24 LAB — POCT PREGNANCY, URINE: PREG TEST UR: NEGATIVE

## 2016-11-24 SURGERY — HYSTERECTOMY, VAGINAL, LAPAROSCOPY-ASSISTED, WITH SALPINGECTOMY
Anesthesia: General | Site: Abdomen

## 2016-11-24 MED ORDER — LINACLOTIDE 290 MCG PO CAPS
290.0000 ug | ORAL_CAPSULE | Freq: Every day | ORAL | Status: DC
  Administered 2016-11-25: 290 ug via ORAL
  Filled 2016-11-24 (×2): qty 1

## 2016-11-24 MED ORDER — PROMETHAZINE HCL 25 MG/ML IJ SOLN
6.2500 mg | INTRAMUSCULAR | Status: DC | PRN
  Filled 2016-11-24: qty 1

## 2016-11-24 MED ORDER — HYDROMORPHONE HCL 1 MG/ML IJ SOLN
0.2500 mg | INTRAMUSCULAR | Status: DC | PRN
  Administered 2016-11-24: 0.25 mg via INTRAVENOUS
  Administered 2016-11-24 (×2): 0.5 mg via INTRAVENOUS
  Administered 2016-11-24: 0.25 mg via INTRAVENOUS
  Filled 2016-11-24: qty 0.5

## 2016-11-24 MED ORDER — ARTIFICIAL TEARS OP OINT
TOPICAL_OINTMENT | OPHTHALMIC | Status: AC
  Filled 2016-11-24: qty 3.5

## 2016-11-24 MED ORDER — SUGAMMADEX SODIUM 200 MG/2ML IV SOLN
INTRAVENOUS | Status: AC
  Filled 2016-11-24: qty 2

## 2016-11-24 MED ORDER — MIDAZOLAM HCL 2 MG/2ML IJ SOLN
INTRAMUSCULAR | Status: AC
  Filled 2016-11-24: qty 2

## 2016-11-24 MED ORDER — OXYCODONE-ACETAMINOPHEN 5-325 MG PO TABS: 1.0000 | ORAL_TABLET | ORAL | Status: DC | PRN

## 2016-11-24 MED ORDER — SODIUM CHLORIDE 0.9 % IR SOLN
Status: DC | PRN
Start: 2016-11-24 — End: 2016-11-24
  Administered 2016-11-24: 1000 mL

## 2016-11-24 MED ORDER — MENTHOL 3 MG MT LOZG: 1.0000 | LOZENGE | OROMUCOSAL | Status: DC | PRN

## 2016-11-24 MED ORDER — METOCLOPRAMIDE HCL 5 MG/ML IJ SOLN
INTRAMUSCULAR | Status: AC
  Filled 2016-11-24: qty 2

## 2016-11-24 MED ORDER — SIMETHICONE 80 MG PO CHEW: 80.0000 mg | CHEWABLE_TABLET | Freq: Four times a day (QID) | ORAL | Status: DC | PRN

## 2016-11-24 MED ORDER — KETOROLAC TROMETHAMINE 30 MG/ML IJ SOLN
30.0000 mg | Freq: Four times a day (QID) | INTRAMUSCULAR | Status: DC
  Administered 2016-11-24 – 2016-11-25 (×4): 30 mg via INTRAVENOUS
  Filled 2016-11-24 (×3): qty 1

## 2016-11-24 MED ORDER — SUGAMMADEX SODIUM 200 MG/2ML IV SOLN
INTRAVENOUS | Status: DC | PRN
  Administered 2016-11-24: 200 mg via INTRAVENOUS

## 2016-11-24 MED ORDER — BUPIVACAINE HCL (PF) 0.25 % IJ SOLN
INTRAMUSCULAR | Status: DC | PRN
  Administered 2016-11-24: 5 mL

## 2016-11-24 MED ORDER — HYDROMORPHONE HCL 2 MG/ML IJ SOLN: 0.2000 mg | INTRAMUSCULAR | Status: DC | PRN

## 2016-11-24 MED ORDER — FENTANYL CITRATE (PF) 100 MCG/2ML IJ SOLN
INTRAMUSCULAR | Status: AC
  Filled 2016-11-24: qty 2

## 2016-11-24 MED ORDER — DOCUSATE SODIUM 100 MG PO CAPS
100.0000 mg | ORAL_CAPSULE | Freq: Two times a day (BID) | ORAL | Status: DC
  Administered 2016-11-24: 100 mg via ORAL
  Filled 2016-11-24: qty 1

## 2016-11-24 MED ORDER — ONDANSETRON HCL 4 MG/2ML IJ SOLN
INTRAMUSCULAR | Status: AC
  Filled 2016-11-24: qty 2

## 2016-11-24 MED ORDER — FENTANYL CITRATE (PF) 100 MCG/2ML IJ SOLN
INTRAMUSCULAR | Status: DC | PRN
  Administered 2016-11-24 (×2): 50 ug via INTRAVENOUS
  Administered 2016-11-24: 25 ug via INTRAVENOUS
  Administered 2016-11-24 (×5): 50 ug via INTRAVENOUS
  Administered 2016-11-24: 25 ug via INTRAVENOUS

## 2016-11-24 MED ORDER — PROPOFOL 10 MG/ML IV BOLUS
INTRAVENOUS | Status: DC | PRN
  Administered 2016-11-24: 30 mg via INTRAVENOUS
  Administered 2016-11-24: 200 mg via INTRAVENOUS

## 2016-11-24 MED ORDER — MIDAZOLAM HCL 5 MG/5ML IJ SOLN
INTRAMUSCULAR | Status: DC | PRN
  Administered 2016-11-24: 2 mg via INTRAVENOUS

## 2016-11-24 MED ORDER — ONDANSETRON HCL 4 MG/2ML IJ SOLN
4.0000 mg | Freq: Four times a day (QID) | INTRAMUSCULAR | Status: DC | PRN
  Administered 2016-11-24: 4 mg via INTRAVENOUS
  Filled 2016-11-24: qty 2

## 2016-11-24 MED ORDER — ONDANSETRON HCL 4 MG PO TABS: 4.0000 mg | ORAL_TABLET | Freq: Four times a day (QID) | ORAL | Status: DC | PRN

## 2016-11-24 MED ORDER — DEXAMETHASONE SODIUM PHOSPHATE 4 MG/ML IJ SOLN
INTRAMUSCULAR | Status: DC | PRN
  Administered 2016-11-24: 10 mg via INTRAVENOUS

## 2016-11-24 MED ORDER — KETOROLAC TROMETHAMINE 30 MG/ML IJ SOLN
30.0000 mg | Freq: Four times a day (QID) | INTRAMUSCULAR | Status: DC
  Filled 2016-11-24: qty 1

## 2016-11-24 MED ORDER — LACTATED RINGERS IV SOLN
INTRAVENOUS | Status: DC
  Filled 2016-11-24: qty 1000

## 2016-11-24 MED ORDER — GENTAMICIN SULFATE 40 MG/ML IJ SOLN
INTRAVENOUS | Status: AC
  Administered 2016-11-24: 08:00:00 via INTRAVENOUS
  Administered 2016-11-24: 100 mL via INTRAVENOUS
  Filled 2016-11-24 (×2): qty 8.75

## 2016-11-24 MED ORDER — OXYCODONE HCL 5 MG/5ML PO SOLN
5.0000 mg | Freq: Once | ORAL | Status: DC | PRN
  Filled 2016-11-24: qty 5

## 2016-11-24 MED ORDER — MEPERIDINE HCL 25 MG/ML IJ SOLN
6.2500 mg | INTRAMUSCULAR | Status: DC | PRN
  Filled 2016-11-24: qty 1

## 2016-11-24 MED ORDER — METOCLOPRAMIDE HCL 5 MG/ML IJ SOLN
INTRAMUSCULAR | Status: DC | PRN
  Administered 2016-11-24: 10 mg via INTRAVENOUS

## 2016-11-24 MED ORDER — DEXAMETHASONE SODIUM PHOSPHATE 10 MG/ML IJ SOLN
INTRAMUSCULAR | Status: AC
  Filled 2016-11-24: qty 1

## 2016-11-24 MED ORDER — FENTANYL CITRATE (PF) 100 MCG/2ML IJ SOLN
INTRAMUSCULAR | Status: AC
  Filled 2016-11-24: qty 4

## 2016-11-24 MED ORDER — OXYCODONE HCL 5 MG PO TABS
5.0000 mg | ORAL_TABLET | Freq: Once | ORAL | Status: DC | PRN
  Filled 2016-11-24: qty 1

## 2016-11-24 MED ORDER — LIDOCAINE HCL (CARDIAC) 20 MG/ML IV SOLN
INTRAVENOUS | Status: DC | PRN
  Administered 2016-11-24: 60 mg via INTRAVENOUS

## 2016-11-24 MED ORDER — KETOROLAC TROMETHAMINE 30 MG/ML IJ SOLN
INTRAMUSCULAR | Status: AC
  Filled 2016-11-24: qty 1

## 2016-11-24 MED ORDER — KETOROLAC TROMETHAMINE 30 MG/ML IJ SOLN
INTRAMUSCULAR | Status: DC | PRN
  Administered 2016-11-24: 30 mg via INTRAVENOUS

## 2016-11-24 MED ORDER — HYDROMORPHONE HCL 1 MG/ML IJ SOLN
INTRAMUSCULAR | Status: AC
  Filled 2016-11-24: qty 1

## 2016-11-24 MED ORDER — BUPROPION HCL ER (XL) 150 MG PO TB24
150.0000 mg | ORAL_TABLET | Freq: Two times a day (BID) | ORAL | Status: DC
  Administered 2016-11-24: 150 mg via ORAL
  Filled 2016-11-24 (×2): qty 1

## 2016-11-24 MED ORDER — ROCURONIUM BROMIDE 50 MG/5ML IV SOSY
PREFILLED_SYRINGE | INTRAVENOUS | Status: AC
  Filled 2016-11-24: qty 5

## 2016-11-24 MED ORDER — PROPOFOL 10 MG/ML IV BOLUS
INTRAVENOUS | Status: AC
  Filled 2016-11-24: qty 40

## 2016-11-24 MED ORDER — LACTATED RINGERS IV SOLN
INTRAVENOUS | Status: DC
  Administered 2016-11-24 (×2): via INTRAVENOUS
  Administered 2016-11-25: 1000 mL via INTRAVENOUS
  Filled 2016-11-24: qty 1000

## 2016-11-24 MED ORDER — ROCURONIUM BROMIDE 50 MG/5ML IV SOSY
PREFILLED_SYRINGE | INTRAVENOUS | Status: DC | PRN
  Administered 2016-11-24: 10 mg via INTRAVENOUS
  Administered 2016-11-24: 25 mg via INTRAVENOUS
  Administered 2016-11-24: 10 mg via INTRAVENOUS
  Administered 2016-11-24: 5 mg via INTRAVENOUS

## 2016-11-24 MED ORDER — LIDOCAINE 2% (20 MG/ML) 5 ML SYRINGE
INTRAMUSCULAR | Status: AC
  Filled 2016-11-24: qty 10

## 2016-11-24 MED ORDER — SUCCINYLCHOLINE CHLORIDE 200 MG/10ML IV SOSY
PREFILLED_SYRINGE | INTRAVENOUS | Status: AC
  Filled 2016-11-24: qty 10

## 2016-11-24 MED ORDER — SUCCINYLCHOLINE CHLORIDE 200 MG/10ML IV SOSY
PREFILLED_SYRINGE | INTRAVENOUS | Status: DC | PRN
  Administered 2016-11-24: 100 mg via INTRAVENOUS

## 2016-11-24 MED ORDER — LACTATED RINGERS IV SOLN
INTRAVENOUS | Status: DC
  Administered 2016-11-24 (×2): via INTRAVENOUS
  Filled 2016-11-24: qty 1000

## 2016-11-24 SURGICAL SUPPLY — 68 items
ADH SKN CLS APL DERMABOND .7 (GAUZE/BANDAGES/DRESSINGS) ×1
BLADE SURG 11 STRL SS (BLADE) ×2 IMPLANT
CANISTER SUCTION 2500CC (MISCELLANEOUS) ×3 IMPLANT
CATH ROBINSON RED A/P 16FR (CATHETERS) ×2 IMPLANT
CHLORAPREP W/TINT 26ML (MISCELLANEOUS) ×2 IMPLANT
COVER BACK TABLE 60X90IN (DRAPES) ×4 IMPLANT
COVER LIGHT HANDLE  1/PK (MISCELLANEOUS) ×2
COVER LIGHT HANDLE 1/PK (MISCELLANEOUS) ×2 IMPLANT
COVER MAYO STAND STRL (DRAPES) ×4 IMPLANT
DERMABOND ADVANCED (GAUZE/BANDAGES/DRESSINGS) ×1
DERMABOND ADVANCED .7 DNX12 (GAUZE/BANDAGES/DRESSINGS) IMPLANT
DRAPE LG THREE QUARTER DISP (DRAPES) ×4 IMPLANT
DRAPE UNDERBUTTOCKS STRL (DRAPE) ×2 IMPLANT
DRSG COVADERM PLUS 2X2 (GAUZE/BANDAGES/DRESSINGS) ×1 IMPLANT
ELECT LIGASURE LONG (ELECTRODE) ×2 IMPLANT
ELECT REM PT RETURN 9FT ADLT (ELECTROSURGICAL) ×2
ELECTRODE REM PT RTRN 9FT ADLT (ELECTROSURGICAL) IMPLANT
GLOVE BIO SURGEON STRL SZ 6 (GLOVE) ×6 IMPLANT
GLOVE BIO SURGEON STRL SZ7 (GLOVE) ×2 IMPLANT
GLOVE BIO SURGEON STRL SZ7.5 (GLOVE) ×1 IMPLANT
GLOVE BIOGEL PI IND STRL 6 (GLOVE) ×1 IMPLANT
GLOVE BIOGEL PI IND STRL 7.0 (GLOVE) IMPLANT
GLOVE BIOGEL PI INDICATOR 6 (GLOVE) ×1
GLOVE BIOGEL PI INDICATOR 7.0 (GLOVE) ×2
GLOVE ECLIPSE 6.5 STRL STRAW (GLOVE) ×2 IMPLANT
GLOVE ECLIPSE 7.0 STRL STRAW (GLOVE) ×3 IMPLANT
GOWN STRL REUS W/ TWL LRG LVL3 (GOWN DISPOSABLE) ×3 IMPLANT
GOWN STRL REUS W/ TWL XL LVL3 (GOWN DISPOSABLE) IMPLANT
GOWN STRL REUS W/TWL 2XL LVL3 (GOWN DISPOSABLE) ×1 IMPLANT
GOWN STRL REUS W/TWL LRG LVL3 (GOWN DISPOSABLE) ×4
GOWN STRL REUS W/TWL XL LVL3 (GOWN DISPOSABLE) ×2
HOLDER FOLEY CATH W/STRAP (MISCELLANEOUS) ×2 IMPLANT
IV NS 1000ML (IV SOLUTION) ×2
IV NS 1000ML BAXH (IV SOLUTION) IMPLANT
KIT ROOM TURNOVER WOR (KITS) ×2 IMPLANT
LIGASURE LAP L-HOOKWIRE 5 44CM (INSTRUMENTS) ×2 IMPLANT
NDL HYPO 25X1 1.5 SAFETY (NEEDLE) ×1 IMPLANT
NDL INSUFFLATION 14GA 120MM (NEEDLE) IMPLANT
NEEDLE HYPO 25X1 1.5 SAFETY (NEEDLE) ×2 IMPLANT
NEEDLE INSUFFLATION 14GA 120MM (NEEDLE) ×2 IMPLANT
NS IRRIG 500ML POUR BTL (IV SOLUTION) ×3 IMPLANT
PACK BASIN DAY SURGERY FS (CUSTOM PROCEDURE TRAY) ×2 IMPLANT
PAD OB MATERNITY 4.3X12.25 (PERSONAL CARE ITEMS) ×2 IMPLANT
PAD PREP 24X48 CUFFED NSTRL (MISCELLANEOUS) ×2 IMPLANT
PADDING ION DISPOSABLE (MISCELLANEOUS) ×2 IMPLANT
PENCIL BUTTON HOLSTER BLD 10FT (ELECTRODE) ×2 IMPLANT
SET IRRIG TUBING LAPAROSCOPIC (IRRIGATION / IRRIGATOR) ×1 IMPLANT
SHEET LAVH (DRAPES) ×2 IMPLANT
SOLUTION ANTI FOG 6CC (MISCELLANEOUS) ×2 IMPLANT
SPONGE LAP 4X18 X RAY DECT (DISPOSABLE) ×2 IMPLANT
SUT MNCRL 0 MO-4 VIOLET 18 CR (SUTURE) ×2 IMPLANT
SUT MNCRL AB 3-0 PS2 18 (SUTURE) ×2 IMPLANT
SUT MON AB 2-0 CT1 36 (SUTURE) ×2 IMPLANT
SUT MONOCRYL 0 MO 4 18  CR/8 (SUTURE) ×2
SUT VICRYL 0 TIES 12 18 (SUTURE) ×2 IMPLANT
SUT VICRYL 0 UR6 27IN ABS (SUTURE) ×2 IMPLANT
SYR BULB IRRIGATION 50ML (SYRINGE) ×2 IMPLANT
SYR CONTROL 10ML LL (SYRINGE) ×2 IMPLANT
SYRINGE 10CC LL (SYRINGE) ×2 IMPLANT
TOWEL OR 17X24 6PK STRL BLUE (TOWEL DISPOSABLE) ×6 IMPLANT
TRAY DSU PREP LF (CUSTOM PROCEDURE TRAY) ×2 IMPLANT
TRAY FOLEY CATH SILVER 14FR (SET/KITS/TRAYS/PACK) ×2 IMPLANT
TROCAR BLADELESS OPT 5 100 (ENDOMECHANICALS) ×4 IMPLANT
TROCAR XCEL NON-BLD 11X100MML (ENDOMECHANICALS) ×2 IMPLANT
TUBE CONNECTING 12X1/4 (SUCTIONS) ×4 IMPLANT
TUBING INSUFFLATION 10FT LAP (TUBING) ×2 IMPLANT
WARMER LAPAROSCOPE (MISCELLANEOUS) ×2 IMPLANT
YANKAUER SUCT BULB TIP NO VENT (SUCTIONS) ×2 IMPLANT

## 2016-11-24 NOTE — Anesthesia Procedure Notes (Addendum)
Procedure Name: Intubation Date/Time: 11/24/2016 7:39 AM Performed by: Nolon Nations Pre-anesthesia Checklist: Patient identified, Emergency Drugs available, Suction available and Patient being monitored Patient Re-evaluated:Patient Re-evaluated prior to inductionOxygen Delivery Method: Circle system utilized Preoxygenation: Pre-oxygenation with 100% oxygen Intubation Type: IV induction Ventilation: Mask ventilation without difficulty Laryngoscope Size: Mac and 3 Grade View: Grade II Tube type: Oral Tube size: 7.0 mm Number of attempts: 1 Airway Equipment and Method: Stylet and LTA kit utilized Placement Confirmation: ETT inserted through vocal cords under direct vision,  positive ETCO2 and breath sounds checked- equal and bilateral Secured at: 21 cm Tube secured with: Tape Dental Injury: Teeth and Oropharynx as per pre-operative assessment

## 2016-11-24 NOTE — Op Note (Signed)
PROCEDURE DATE: 11/24/2016  PREOPERATIVE DIAGNOSIS: Heavy menstrual bleeding, dysmenorrhea, left pelvic pain POSTOPERATIVE DIAGNOSIS: The same  PROCEDURE: Laparoscopic Assisted Vaginal Hysterectomy, Bilateral salpingectomy, left oophorectomy SURGEON: Dr. Mitchel HonourMegan Kari Kerth  ASSISTANT: Dr. Thressa ShellerJim Tomblin INDICATIONS: 35 y.o. G4P3 with heavy menstrual bleeding, dysmenorrhea, and left pelvic pain desiring definitive surgical management. Risks of surgery were discussed with the patient including but not limited to: bleeding which may require transfusion or reoperation; infection which may require antibiotics; injury to bowel, bladder, ureters or other surrounding organs; need for additional procedures including laparotomy; thromboembolic phenomenon, incisional problems and other postoperative/anesthesia complications. Written informed consent was obtained.  FINDINGS: Small uterus, normal adnexa bilaterally. No evidence of endometriosis. Normal upper abdomen. Normal appearing appendix ANESTHESIA: General  ESTIMATED BLOOD LOSS: 100 ml  SPECIMENS: Uterus, cervix, bilateral fallopian tubes and left ovary COMPLICATIONS: None immediate  PROCEDURE IN DETAIL: The patient received intravenous antibiotics and had sequential compression devices applied to her lower extremities while in the preoperative area. She was then taken to the operating room where general anesthesia was administered and was found to be adequate. She was placed in the dorsal lithotomy position, and was prepped and draped in a sterile manner. An in and out catheterization was performed. A uterine manipulator was then advanced into the uterus . After an adequate timeout was performed, attention was then turned to the patient's abdomen where a 10-mm skin incision was made in the umbilical fold. The Veress needle was carefully introduced into the peritoneal cavity through the abdominal wall. Intraperitoneal placement was confirmed by drop in intraabdominal  pressure with insufflation of carbon dioxide gas. Adequate pneumoperitoneum was obtained, and the 10 mm trocar and sleeve were then advanced without difficulty into the abdomen where intraabdominal placement was confirmed by the laparoscope. A survey of the patient's pelvis and abdomen revealed entirely normal anatomy. Suprapubic 5 mm port was then placed under direct visualization. The pelvis was then carefully examined. On the right side, the fallopian tube was removed using the LigaSure (s/p BTL).  The tube was removed from the umbilical port with plans to send with rest of specimen. The round ligament was then clamped and transected with the Ligasure. The uteroovarian ligament was also clamped and transected. The leaves of the broad ligament were separated and serially transected. These procedures were then repeated on the left side but including the left ovary and IP.  The ureters were noted to be safely away from the area of dissection.  At this point, attention was turned to the vaginal portion of the case. A weighted speculum was placed posteriorly, a Deaver anteriorly, and the cervix grasped with a thyroid tenaculum. Once the anterior and posterior reflections were identified, the cervix was circumscribed using the Bovie knife. Next, using Mayos, the posterior cul-de-sac was entered. A Haney clamp was used to clamp the uterosacrals and the pedicle was suture ligated and tagged using 0 monocryl.  Next, the bladder reflection was identified. Using Metzenbaums, it was entered and palpation and direct visualization confirmed proper location. Next, using the LigaSure, the uterine arteries were coapted and cut bilaterally. The pedicles were visualized after coaptation and were hemostatic. The same was performed sequentially cephalad until the uterus, cervix, and left ovary were removed. The pedicles were inspected and found to be hemostatic.  Next, the posterior cuff was run using 0 monocryl in a running locked  fashion.  The tagged uterosacrals were tied together centrally for support. The remainder of the cuff was closed using 2-0 monocryl suture in  a series of figure of eight stitches. The cuff was inspected and found to be hemostatic.  Foley catheter was placed and clear urine drained.  Attention was returned to the abdomen were a second laparoscopic look was taken. All pedicles were hemostatic. There were a few oozing vessels near the bladder flap that were rendered hemostatic using the Gyrus. Insufflation was removed after all instruments were removed.  All skin incisions were closed with 4-0 Vicryl subcuticular stitches and Dermabond. The patient tolerated the procedures well. All instruments, needles, and sponge counts were correct x 2. The patient was taken to the recovery room awake, extubated and in stable condition.

## 2016-11-24 NOTE — Progress Notes (Signed)
NOS  C/o abdominal pain.  No n/v.  No CP/SOB.  VSS.  AF.  UOP clear  Gen: A&O x 3 Abd: soft, inc c/d/i Ext: no c/c/e  POD#0 s/p LAVH, bilateral salpingectomy, left oophorectomy -Advance diet as tolerated -Ambulate  -AM labs pending -D/C IVF and foley in AM -Pain control  Mitchel HonourMegan Kathreen Dileo, DO

## 2016-11-24 NOTE — Transfer of Care (Signed)
Last Vitals:  Vitals:   11/24/16 0559  BP: 120/75  Pulse: 84  Resp: 16  Temp: 36.7 C    Last Pain:  Vitals:   11/24/16 0621  TempSrc:   PainSc: 5       Patients Stated Pain Goal: 10 (11/24/16 [id number])  Immediate Anesthesia Transfer of Care Note  Patient: Cinde Wieczorek  Procedure(s) Performed: Procedure(s) (LRB): LAPAROSCOPIC ASSISTED VAGINAL HYSTERECTOMY WITH BILATERAL SALPINGECTOMY, LEFT OOPHERECTOMY (N/A)  Patient Location: PACU  Anesthesia Type: General  Level of Consciousness: awake, alert  and oriented  Airway & Oxygen Therapy: Patient Spontanous Breathing and Patient connected to nasal cannula oxygen  Post-op Assessment: Report given to PACU RN and Post -op Vital signs reviewed and stable  Post vital signs: Reviewed and stable  Complications: No apparent anesthesia complications

## 2016-11-24 NOTE — Anesthesia Postprocedure Evaluation (Signed)
Anesthesia Post Note  Patient: Linda Tyler  Procedure(s) Performed: Procedure(s) (LRB): LAPAROSCOPIC ASSISTED VAGINAL HYSTERECTOMY WITH BILATERAL SALPINGECTOMY, LEFT OOPHERECTOMY (N/A)  Patient location during evaluation: PACU Anesthesia Type: General Level of consciousness: sedated and patient cooperative Pain management: pain level controlled Vital Signs Assessment: post-procedure vital signs reviewed and stable Respiratory status: spontaneous breathing Cardiovascular status: stable Anesthetic complications: no    Last Vitals:  Vitals:   11/24/16 1249 11/24/16 1255  BP: (!) 84/51 (!) 93/55  Pulse:    Resp:    Temp:      Last Pain:  Vitals:   11/24/16 1300  TempSrc:   PainSc: 4                  [name]

## 2016-11-24 NOTE — Progress Notes (Signed)
No change to H&P.  Analissa Bayless, DO 

## 2016-11-25 ENCOUNTER — Encounter (HOSPITAL_BASED_OUTPATIENT_CLINIC_OR_DEPARTMENT_OTHER): Payer: Self-pay | Admitting: Obstetrics & Gynecology

## 2016-11-25 DIAGNOSIS — N920 Excessive and frequent menstruation with regular cycle: Secondary | ICD-10-CM | POA: Diagnosis not present

## 2016-11-25 LAB — COMPREHENSIVE METABOLIC PANEL
ALBUMIN: 3 g/dL — AB (ref 3.5–5.0)
ALT: 12 U/L — ABNORMAL LOW (ref 14–54)
ANION GAP: 5 (ref 5–15)
AST: 15 U/L (ref 15–41)
Alkaline Phosphatase: 40 U/L (ref 38–126)
BILIRUBIN TOTAL: 0.4 mg/dL (ref 0.3–1.2)
BUN: 8 mg/dL (ref 6–20)
CO2: 26 mmol/L (ref 22–32)
Calcium: 7.6 mg/dL — ABNORMAL LOW (ref 8.9–10.3)
Chloride: 104 mmol/L (ref 101–111)
Creatinine, Ser: 0.59 mg/dL (ref 0.44–1.00)
GFR calc non Af Amer: >60 mL/min (ref >60)
GLUCOSE: 113 mg/dL — AB (ref 65–99)
POTASSIUM: 3.7 mmol/L (ref 3.5–5.1)
Sodium: 135 mmol/L (ref 135–145)
TOTAL PROTEIN: 5 g/dL — AB (ref 6.5–8.1)

## 2016-11-25 LAB — CBC
HEMATOCRIT: 24.2 % — AB (ref 36.0–46.0)
Hemoglobin: 8.4 g/dL — ABNORMAL LOW (ref 12.0–15.0)
MCH: 32.9 pg (ref 26.0–34.0)
MCHC: 34.7 g/dL (ref 30.0–36.0)
MCV: 94.9 fL (ref 78.0–100.0)
Platelets: 201 10*3/uL (ref 150–400)
RBC: 2.55 MIL/uL — ABNORMAL LOW (ref 3.87–5.11)
RDW: 12.7 % (ref 11.5–15.5)
WBC: 10.6 10*3/uL — ABNORMAL HIGH (ref 4.0–10.5)

## 2016-11-25 MED ORDER — IBUPROFEN 800 MG PO TABS: 800.0000 mg | ORAL_TABLET | Freq: Four times a day (QID) | ORAL | 1 refills | Status: DC | PRN

## 2016-11-25 MED ORDER — OXYCODONE-ACETAMINOPHEN 5-325 MG PO TABS: 1.0000 | ORAL_TABLET | ORAL | 0 refills | Status: DC | PRN

## 2016-11-25 NOTE — Progress Notes (Signed)
Patient feeling well.  Voiding without difficulty.  Passing flatus.  No n/v.  Tolerating full diet.  Ambulating well. Pain well controlled.   VSS. AF.  UOP adequate. Labs reviewed.  Gen: A&O x 3 Abd: soft, ND, inc c/d/i x 2 Ext: no c/c/e  POD#1 s/p LAVH, b/l salpingectomy, left oophorectomy -D/C home

## 2016-11-25 NOTE — Progress Notes (Signed)
Pt voiding ,pasiing flatus, tolerating food and taking oral medicine. Ambulatory. Denies pain and desires discharge. Discharge instructions discussed until no further questions ask. Iv discontinued

## 2016-11-25 NOTE — Discharge Instructions (Signed)
Call MD for T>100.4, heavy vaginal bleeding, severe abdominal pain, intractable nausea and/or vomiting, or respiratory distress.  Call office to schedule postop appointment in 2 weeks.  No driving while taking narcotics.  Pelvic rest x 6 weeks. °

## 2016-11-25 NOTE — Discharge Summary (Signed)
Physician Discharge Summary  Patient ID: Linda Tyler MRN: [id number] DOB/AGE: 34-21-1982 34 y.o.  Admit date: 11/24/2016 Discharge date: 11/25/2016  Admission Diagnoses:  Heavy menstrual bleeding, dysmenorrhea, left pelvic pain  Discharge Diagnoses:  SAA Active Problems:   S/P hysterectomy   Discharged Condition: good  Hospital Course: The patient was admitted for definitive surgical treatment of the above.  She underwent an uncomplicated LAVH, bilateral salpingectomy, left oophorectomy.  She did well postoperatively and on POD#1, she was meeting all goals.  She was discharged home with office f/u.  Consults: None  Significant Diagnostic Studies: none  Treatments: surgery: LAVH, bilateral salpingectomy, left oophorectomy  Discharge Exam: Blood pressure (!) 95/45, pulse 91, temperature 98.2 F (36.8 C), temperature source Oral, resp. rate 16, height 5\' 7"  (1.702 m), weight 156 lb (70.8 kg), last menstrual period 11/13/2016, SpO2 100 %. General appearance: alert, cooperative and appears stated age GI: appropriate ttp Extremities: extremities normal, atraumatic, no cyanosis or edema Incision/Wound: c/d/i x 2  Disposition: 01-Home or Self Care  Discharge Instructions    Discharge    Complete by:  As directed        Medication List    STOP taking these medications   ALEVE PO     TAKE these medications   b complex vitamins capsule Take 1 capsule by mouth daily.   buPROPion 150 MG 24 hr tablet Commonly known as:  WELLBUTRIN XL Take 150 mg by mouth 2 (two) times daily.   gabapentin 300 MG capsule Commonly known as:  NEURONTIN Take 300 mg by mouth 3 (three) times daily.   ibuprofen 800 MG tablet Commonly known as:  ADVIL Take 1 tablet (800 mg total) by mouth every 6 (six) hours as needed.   LINZESS 290 MCG Caps capsule Generic drug:  linaclotide Take 290 mcg by mouth daily before breakfast.   multivitamin tablet Take 1 tablet by mouth daily.    oxyCODONE-acetaminophen 5-325 MG tablet Commonly known as:  PERCOCET/ROXICET Take 1-2 tablets by mouth every 4 (four) hours as needed (moderate to severe pain (when tolerating fluids)).   VITA HAIR Tabs Take by mouth daily. Skin,hair,nail vitamin        SignedMitchel Honour: Cristen Murcia 11/25/2016, 11:45 AM

## 2017-11-15 ENCOUNTER — Other Ambulatory Visit: Payer: Self-pay

## 2017-11-15 ENCOUNTER — Encounter: Payer: Self-pay | Admitting: Psychiatry

## 2017-11-15 ENCOUNTER — Inpatient Hospital Stay
Admission: AD | Admit: 2017-11-15 | Discharge: 2017-11-23 | DRG: 885 | Disposition: A | Discharge status: Home / Self Care | Payer: BLUE CROSS/BLUE SHIELD | Attending: Psychiatry | Admitting: Psychiatry

## 2017-11-15 ENCOUNTER — Emergency Department
Admission: EM | Admit: 2017-11-15 | Discharge: 2017-11-15 | Disposition: A | Discharge status: Other Acute Inpatient Hospital | Payer: BLUE CROSS/BLUE SHIELD | Attending: Emergency Medicine | Admitting: Emergency Medicine

## 2017-11-15 DIAGNOSIS — R45851 Suicidal ideations: Secondary | ICD-10-CM

## 2017-11-15 DIAGNOSIS — F10920 Alcohol use, unspecified with intoxication, uncomplicated: Secondary | ICD-10-CM

## 2017-11-15 DIAGNOSIS — F429 Obsessive-compulsive disorder, unspecified: Secondary | ICD-10-CM | POA: Diagnosis present

## 2017-11-15 DIAGNOSIS — Z888 Allergy status to other drugs, medicaments and biological substances status: Secondary | ICD-10-CM | POA: Diagnosis not present

## 2017-11-15 DIAGNOSIS — Z915 Personal history of self-harm: Secondary | ICD-10-CM

## 2017-11-15 DIAGNOSIS — Z90710 Acquired absence of both cervix and uterus: Secondary | ICD-10-CM

## 2017-11-15 DIAGNOSIS — Z886 Allergy status to analgesic agent status: Secondary | ICD-10-CM

## 2017-11-15 DIAGNOSIS — Z818 Family history of other mental and behavioral disorders: Secondary | ICD-10-CM | POA: Diagnosis not present

## 2017-11-15 DIAGNOSIS — Z9079 Acquired absence of other genital organ(s): Secondary | ICD-10-CM | POA: Diagnosis not present

## 2017-11-15 DIAGNOSIS — Z91018 Allergy to other foods: Secondary | ICD-10-CM

## 2017-11-15 DIAGNOSIS — Z79899 Other long term (current) drug therapy: Secondary | ICD-10-CM

## 2017-11-15 DIAGNOSIS — K581 Irritable bowel syndrome with constipation: Secondary | ICD-10-CM | POA: Diagnosis present

## 2017-11-15 DIAGNOSIS — F250 Schizoaffective disorder, bipolar type: Secondary | ICD-10-CM | POA: Diagnosis present

## 2017-11-15 DIAGNOSIS — F410 Panic disorder [episodic paroxysmal anxiety] without agoraphobia: Secondary | ICD-10-CM | POA: Diagnosis present

## 2017-11-15 DIAGNOSIS — F1020 Alcohol dependence, uncomplicated: Secondary | ICD-10-CM | POA: Diagnosis present

## 2017-11-15 DIAGNOSIS — F4310 Post-traumatic stress disorder, unspecified: Secondary | ICD-10-CM | POA: Diagnosis present

## 2017-11-15 DIAGNOSIS — R440 Auditory hallucinations: Secondary | ICD-10-CM | POA: Diagnosis not present

## 2017-11-15 DIAGNOSIS — Z87891 Personal history of nicotine dependence: Secondary | ICD-10-CM | POA: Insufficient documentation

## 2017-11-15 DIAGNOSIS — F331 Major depressive disorder, recurrent, moderate: Secondary | ICD-10-CM | POA: Diagnosis not present

## 2017-11-15 DIAGNOSIS — G4700 Insomnia, unspecified: Secondary | ICD-10-CM | POA: Diagnosis present

## 2017-11-15 DIAGNOSIS — Z880 Allergy status to penicillin: Secondary | ICD-10-CM

## 2017-11-15 DIAGNOSIS — Z90721 Acquired absence of ovaries, unilateral: Secondary | ICD-10-CM

## 2017-11-15 DIAGNOSIS — F22 Delusional disorders: Secondary | ICD-10-CM | POA: Insufficient documentation

## 2017-11-15 DIAGNOSIS — Z881 Allergy status to other antibiotic agents status: Secondary | ICD-10-CM

## 2017-11-15 DIAGNOSIS — Z62810 Personal history of physical and sexual abuse in childhood: Secondary | ICD-10-CM | POA: Diagnosis present

## 2017-11-15 DIAGNOSIS — Z716 Tobacco abuse counseling: Secondary | ICD-10-CM

## 2017-11-15 DIAGNOSIS — Z046 Encounter for general psychiatric examination, requested by authority: Secondary | ICD-10-CM | POA: Diagnosis not present

## 2017-11-15 DIAGNOSIS — F17210 Nicotine dependence, cigarettes, uncomplicated: Secondary | ICD-10-CM | POA: Diagnosis present

## 2017-11-15 DIAGNOSIS — F333 Major depressive disorder, recurrent, severe with psychotic symptoms: Secondary | ICD-10-CM | POA: Diagnosis present

## 2017-11-15 DIAGNOSIS — F329 Major depressive disorder, single episode, unspecified: Secondary | ICD-10-CM | POA: Diagnosis present

## 2017-11-15 DIAGNOSIS — F4010 Social phobia, unspecified: Secondary | ICD-10-CM | POA: Diagnosis present

## 2017-11-15 LAB — CBC
HEMATOCRIT: 45 % (ref 35.0–47.0)
HEMOGLOBIN: 15.5 g/dL (ref 12.0–16.0)
MCH: 33.7 pg (ref 26.0–34.0)
MCHC: 34.5 g/dL (ref 32.0–36.0)
MCV: 97.7 fL (ref 80.0–100.0)
Platelets: 258 10*3/uL (ref 150–440)
RBC: 4.6 MIL/uL (ref 3.80–5.20)
RDW: 13.4 % (ref 11.5–14.5)
WBC: 8.3 10*3/uL (ref 3.6–11.0)

## 2017-11-15 LAB — COMPREHENSIVE METABOLIC PANEL
ALBUMIN: 4.4 g/dL (ref 3.5–5.0)
ALT: 19 U/L (ref 14–54)
AST: 23 U/L (ref 15–41)
Alkaline Phosphatase: 57 U/L (ref 38–126)
Anion gap: 10 (ref 5–15)
BUN: 8 mg/dL (ref 6–20)
CHLORIDE: 110 mmol/L (ref 101–111)
CO2: 20 mmol/L — ABNORMAL LOW (ref 22–32)
Calcium: 9.4 mg/dL (ref 8.9–10.3)
Creatinine, Ser: 0.52 mg/dL (ref 0.44–1.00)
GFR calc Af Amer: >60 mL/min (ref >60)
GFR calc non Af Amer: >60 mL/min (ref >60)
GLUCOSE: 107 mg/dL — AB (ref 65–99)
POTASSIUM: 4.1 mmol/L (ref 3.5–5.1)
Sodium: 140 mmol/L (ref 135–145)
Total Bilirubin: <0.1 mg/dL — ABNORMAL LOW (ref 0.3–1.2)
Total Protein: 7.8 g/dL (ref 6.5–8.1)

## 2017-11-15 LAB — URINE DRUG SCREEN, QUALITATIVE (ARMC ONLY)
Amphetamines, Ur Screen: NOT DETECTED
Barbiturates, Ur Screen: NOT DETECTED
Benzodiazepine, Ur Scrn: NOT DETECTED
CANNABINOID 50 NG, UR ~~LOC~~: NOT DETECTED
COCAINE METABOLITE, UR ~~LOC~~: NOT DETECTED
MDMA (Ecstasy)Ur Screen: NOT DETECTED
Methadone Scn, Ur: NOT DETECTED
OPIATE, UR SCREEN: NOT DETECTED
Phencyclidine (PCP) Ur S: NOT DETECTED
Tricyclic, Ur Screen: NOT DETECTED

## 2017-11-15 LAB — ACETAMINOPHEN LEVEL: Acetaminophen (Tylenol), Serum: <10 ug/mL — ABNORMAL LOW (ref 10–30)

## 2017-11-15 LAB — SALICYLATE LEVEL

## 2017-11-15 LAB — POCT PREGNANCY, URINE: Preg Test, Ur: NEGATIVE

## 2017-11-15 LAB — ETHANOL: Alcohol, Ethyl (B): 135 mg/dL — ABNORMAL HIGH (ref <10)

## 2017-11-15 MED ORDER — LINACLOTIDE 145 MCG PO CAPS
290.0000 ug | ORAL_CAPSULE | Freq: Every day | ORAL | Status: DC
  Administered 2017-11-16 – 2017-11-23 (×8): 290 ug via ORAL
  Filled 2017-11-15 (×9): qty 2

## 2017-11-15 MED ORDER — OLANZAPINE 10 MG IM SOLR: 10.0000 mg | Freq: Two times a day (BID) | INTRAMUSCULAR | Status: DC | PRN

## 2017-11-15 MED ORDER — LINACLOTIDE 145 MCG PO CAPS
290.0000 ug | ORAL_CAPSULE | Freq: Every day | ORAL | Status: DC
  Administered 2017-11-15: 290 ug via ORAL
  Filled 2017-11-15: qty 1

## 2017-11-15 MED ORDER — GABAPENTIN 300 MG PO CAPS
300.0000 mg | ORAL_CAPSULE | Freq: Three times a day (TID) | ORAL | Status: DC
  Administered 2017-11-16 – 2017-11-23 (×23): 300 mg via ORAL
  Filled 2017-11-15 (×23): qty 1

## 2017-11-15 MED ORDER — GABAPENTIN 300 MG PO CAPS
300.0000 mg | ORAL_CAPSULE | Freq: Three times a day (TID) | ORAL | Status: DC
  Administered 2017-11-15 (×3): 300 mg via ORAL
  Filled 2017-11-15 (×3): qty 1

## 2017-11-15 MED ORDER — ALUM & MAG HYDROXIDE-SIMETH 200-200-20 MG/5ML PO SUSP
30.0000 mL | ORAL | Status: DC | PRN
  Administered 2017-11-21: 30 mL via ORAL

## 2017-11-15 MED ORDER — MAGNESIUM HYDROXIDE 400 MG/5ML PO SUSP
30.0000 mL | Freq: Every day | ORAL | Status: DC | PRN
  Administered 2017-11-20: 30 mL via ORAL
  Filled 2017-11-15 (×2): qty 30

## 2017-11-15 MED ORDER — NICOTINE 21 MG/24HR TD PT24
21.0000 mg | MEDICATED_PATCH | Freq: Once | TRANSDERMAL | Status: DC
  Administered 2017-11-15: 21 mg via TRANSDERMAL
  Filled 2017-11-15: qty 1

## 2017-11-15 MED ORDER — ACETAMINOPHEN 325 MG PO TABS: 650.0000 mg | ORAL_TABLET | Freq: Four times a day (QID) | ORAL | Status: DC | PRN

## 2017-11-15 MED ORDER — LORAZEPAM 1 MG PO TABS
1.0000 mg | ORAL_TABLET | Freq: Once | ORAL | Status: AC
  Administered 2017-11-15: 1 mg via ORAL
  Filled 2017-11-15: qty 1

## 2017-11-15 MED ORDER — IBUPROFEN 600 MG PO TABS: 800.0000 mg | ORAL_TABLET | Freq: Four times a day (QID) | ORAL | Status: DC | PRN

## 2017-11-15 MED ORDER — BUTALBITAL-APAP-CAFFEINE 50-325-40 MG PO TABS
1.0000 | ORAL_TABLET | Freq: Once | ORAL | Status: AC
  Administered 2017-11-15: 1 via ORAL
  Filled 2017-11-15: qty 1

## 2017-11-15 NOTE — Progress Notes (Signed)
Will admit to psychiatry.

## 2017-11-15 NOTE — ED Triage Notes (Signed)
Patient brought in by spouse. Spouse stated that about 3 weeks ago the patient tried to commit suicide. The spouse states the he received a text message from the patient asking where the gun was. The spouse stated that he went home and found bullets everywhere and found the patient outside with the gun. The spouse states that the patient stated that the gun would not work and that he found that there were no bullets in the chamber. Spouse states that the patient did not see anyone at that time for SI.

## 2017-11-15 NOTE — ED Notes (Signed)
PT IVC/SOC COMPLETE/SOC RECOMMENDS INPATIENT ADMISSION. 

## 2017-11-15 NOTE — ED Notes (Signed)
Patient alert and verbal. Patient states she was drunk and said some crazy things to her husband but would not go into detail with this Clinical research associatewriter. Patient denies SI/HI. Patient states she believes there are guardian angels and devils. Patient states she hears voices telling her she is worthless. Patient states she had 3 past suicide attempts. Patient pauses when engaging in conversation states she doesn't want to say anything that's how she came here because her husband told her to tell him what was on her mind and then she ended up here. Q 15 minute checks in progress and patient remains safe on unit. Monitoring continues.

## 2017-11-15 NOTE — ED Notes (Signed)
[facility]; Pt. Verbalizes no complaints of having Depression; having auditory and visual hallucination; and stating, "I see yellow stars and yellow stripes on the wall and distress with stating, "everybody would be better off without me being around; they say, I need to be put away and never let out; verbalizes having  S.I; with having made several prior attempts of cutting; overdose; and recently with using a gun; denies having Hi. Continue to monitor with 15 min. Monitoring.

## 2017-11-15 NOTE — Tx Team (Signed)
Initial Treatment Plan 11/15/2017 11:48 PM Linda Knudsenonya Marie Squitieri YQM:[id number]    PATIENT STRESSORS: Health problems Medication change or noncompliance Substance abuse Traumatic event Other: hallucinations   PATIENT STRENGTHS: Ability for insight Active sense of humor Communication skills General fund of knowledge Motivation for treatment/growth Supportive family/friends   PATIENT IDENTIFIED PROBLEMS: Substance abuse  Hallucinations  Social anxiety  depression               DISCHARGE CRITERIA:  Ability to meet basic life and health needs Improved stabilization in mood, thinking, and/or behavior Motivation to continue treatment in a less acute level of care Reduction of life-threatening or endangering symptoms to within safe limits Safe-care adequate arrangements made Verbal commitment to aftercare and medication compliance Withdrawal symptoms are absent or subacute and managed without 24-hour nursing intervention  PRELIMINARY DISCHARGE PLAN: Attend aftercare/continuing care group Attend PHP/IOP Attend 12-step recovery group Outpatient therapy Return to previous living arrangement Return to previous work or school arrangements  PATIENT/FAMILY INVOLVEMENT: This treatment plan has been presented to and reviewed with the patient, Linda Tyler.  The patient have been given the opportunity to ask questions and make suggestions.  Lenox Pondserry J Dakoda Laventure, RN 11/15/2017, 11:48 PM

## 2017-11-15 NOTE — ED Notes (Signed)
[facility] Pt taken to Rm 36 with ODS officer McAdoo and EDT Judeth CornfieldStephanie for consult.

## 2017-11-15 NOTE — BH Assessment (Signed)
[facility].  Writer provided [facility] Rounding Physician (Dr. Selinda MichaelsIsbel) patient's information for review to determine if [facility].  [facility] by Dr. Selinda MichaelsIsbel.  Attending Physician will be Dr. Jennet MaduroPucilowska.   Patient has been [facility] to room 301, by [facility] Charge Nurse Willow GroveGwen F.   Intake Paper Work has been signed and placed on patient chart.  ER staff is aware of the admission Christen Bame(Ronnie, ER Sect.; Dr. Scotty CourtStafford, ER MD; Britta MccreedyBarbara, Patient's Nurse & Marylene LandAngela, Patient Access).

## 2017-11-15 NOTE — ED Notes (Signed)
Patient received PM snack. 

## 2017-11-15 NOTE — ED Notes (Signed)
Pt states her husband is a Naval architecttruck driver and she is fearful he "is going to leave me, I don't want people to think i'm crazy". Pt states she has been hearing "things" for "awhile now". Pt states "they tell me i'm worthless". Pt states "I leave drinks around the house, I have to stop drinking them because I start hearing the voices and they tell me not to drink it." pt is very tearful. Pt states she has been diagnosed with depression and anxiety and was prescribed paxil and wellbutrin without noticing a difference in auditory hallucinations. Pt denies current thoughts of harming self, but then states "I wrote these letters that explain it" and will not further elaborate on suicidal thoughts or previous gestures. Dr. Dolores FrameSung is currently examining pt.

## 2017-11-15 NOTE — ED Triage Notes (Signed)
Patient tearful in triage.  When asked why she is here patient reports she has letters to herself that explains it better.  Patient reports she tried to hurt herself but the gun wouldn't go off.  Patient reports hearing voices and that she has heard voices for years but that she has been told she is depressed.  Patient reports feels like she is recorded at her home.

## 2017-11-15 NOTE — BH Assessment (Signed)
Assessment Note  Linda Tyler is an 36 y.o. female presenting to the ED voluntarily for worsening depression and suicidal ideations.  Patient reports she made a suicide attempt about 3 weeks ago when she took her husband's gun and tried to shoot herself.  Pt states she pulled the trigger but nothing happened.  Patient, however, was aware that there were no bullets in the gun.  Patient is under the delusion that her husband and family want her "locked away" forever.  She also believes that her husband is going to divorce her, even though she knows she thoughts are not true.  Patients states that her spouse and family are "tired of living through her alcohol addiction.  Pt says she used to drink liquor but now only drinks wine and beer.  She reports she drank about 6 beers and had a couple of glasses of wine.  Patient states "I need to be locked away for a long time.  My family and my cats would be better off if I were out of their lives."  Patient's BAC =135.  Diagnosis: Major Depressive Disorder  Past Medical History:  Past Medical History:  Diagnosis Date  . Abnormal uterine bleeding (AUB)   . Cellulitis   . Irritable bowel syndrome with constipation 10/2016  . LV dysfunction    EF 45-50% by 2-D echo in the past  . Skin rash     Past Surgical History:  Procedure Laterality Date  . dental extra  2016  . LAPAROSCOPIC VAGINAL HYSTERECTOMY WITH SALPINGECTOMY N/A 11/24/2016   Procedure: LAPAROSCOPIC ASSISTED VAGINAL HYSTERECTOMY WITH BILATERAL SALPINGECTOMY, LEFT OOPHERECTOMY;  Surgeon: Mitchel HonourMegan Morris, DO;  Location[facility];  Service: Gynecology;  Laterality: N/A;  . TRANSTHORACIC ECHOCARDIOGRAM  06/25/2012   EF 45% TO 50%. RV- CAVITY SIZE MODERATELY DECREASED.  Marland Kitchen. TUBAL LIGATION  2007    Family History:  Family History  Problem Relation Age of Onset  . Diabetes Mother   . Heart failure Mother   . Heart disease Mother        died in her 3150s with a massive MI  . Heart  failure Father   . Hypertension Father     Social History:  reports that she quit smoking about 3 years ago. Her smoking use included cigarettes. She smoked 0.00 packs per day. she has never used smokeless tobacco. She reports that she does not drink alcohol or use drugs.  Additional Social History:  Alcohol / Drug Use Pain Medications: See PTA Prescriptions: See PTA Over the Counter: See PTA History of alcohol / drug use?: Yes Longest period of sobriety (when/how long): ETOH  CIWA: CIWA-Ar BP: 110/80 Pulse Rate: (!) 110 COWS:    Allergies:  Allergies  Allergen Reactions  . Cephalexin Anaphylaxis  . Coconut Flavor Anaphylaxis  . Sulfa Antibiotics Anaphylaxis and Hives  . Penicillins Nausea And Vomiting    Has patient had a PCN reaction causing immediate rash, facial/tongue/throat swelling, SOB or lightheadedness with hypotension: no Has patient had a PCN reaction causing severe rash involving mucus membranes or skin necrosis: yes Has patient had a PCN reaction that required hospitalization: no Has patient had a PCN reaction occurring within the last 10 years: no If all of the above answers are "NO", then may proceed with Cephalosporin use.   . Aspirin Swelling and Rash    Home Medications:  (Not in a hospital admission)  OB/GYN Status:  Patient's last menstrual period was 11/13/2016.  General Assessment Data [facility]  [facility] Assessment: In system Is this a Tele or Face-to-Face Assessment?: Face-to-Face Is this an Initial Assessment or a Re-assessment for this encounter?: Initial Assessment Marital status: Married MusellaMaiden name: n/a Is patient pregnant?: No Pregnancy Status: No Living Arrangements: Spouse/significant other Can pt return to current living arrangement?: Yes Admission Status: Voluntary Is patient capable of signing voluntary admission?: Yes Referral Source: Self/Family/Friend Insurance type: BCBS     Crisis Care Plan Living  Arrangements: Spouse/significant other Legal Guardian: Other:(self) Name of Psychiatrist: none reported Name of Therapist: none reported  Education Status Is patient currently in school?: No Current Grade: hs Highest grade of school patient has completed: hs Name of school: na Contact person: na  Risk to self with the past 6 months Suicidal Ideation: Yes-Currently Present Has patient been a risk to self within the past 6 months prior to admission? : Yes Suicidal Intent: Yes-Currently Present Has patient had any suicidal intent within the past 6 months prior to admission? : Yes Is patient at risk for suicide?: Yes Suicidal Plan?: Yes-Currently Present Has patient had any suicidal plan within the past 6 months prior to admission? : Yes Specify Current Suicidal Plan: Pt had a plan to shoot herself Access to Means: Yes Specify Access to Suicidal Means: Pt has access to a gun What has been your use of drugs/alcohol within the last 12 months?: alcohol Previous Attempts/Gestures: Yes How many times?: 1 Other Self Harm Risks: none identified Triggers for Past Attempts: Spouse contact, Family contact Intentional Self Injurious Behavior: None Family Suicide History: No Recent stressful life event(s): Conflict (Comment)(relationship issues) Persecutory voices/beliefs?: Yes Depression: Yes Depression Symptoms: Insomnia, Isolating, Guilt, Feeling worthless/self pity, Loss of interest in usual pleasures Substance abuse history and/or treatment for substance abuse?: Yes Suicide prevention information given to non-admitted patients: Not applicable  Risk to Others within the past 6 months Homicidal Ideation: No Does patient have any lifetime risk of violence toward others beyond the six months prior to admission? : No Thoughts of Harm to Others: No Current Homicidal Intent: No Current Homicidal Plan: No Access to Homicidal Means: No Identified Victim: none identified History of harm to  others?: No Assessment of Violence: None Noted Does patient have access to weapons?: Yes (Comment) Criminal Charges Pending?: No Does patient have a court date: No Is patient on probation?: No  Psychosis Hallucinations: None noted Delusions: None noted  Mental Status Report Appearance/Hygiene: In scrubs Eye Contact: Good Motor Activity: Freedom of movement Speech: Logical/coherent Level of Consciousness: Crying Mood: Suspicious, Helpless, Sad, Depressed Affect: Depressed, Sad Anxiety Level: Minimal Thought Processes: Relevant Judgement: Impaired Orientation: Person, Place, Time, Situation Obsessive Compulsive Thoughts/Behaviors: None  Cognitive Functioning Concentration: Normal Memory: Recent Intact, Remote Intact IQ: Average Insight: Poor Impulse Control: Poor Appetite: Poor Weight Loss: 0 Weight Gain: 0 Sleep: Decreased Total Hours of Sleep: 5 Vegetative Symptoms: None  ADLScreening [facility] Assessment Services) Patient's cognitive ability adequate to safely complete daily activities?: Yes Patient able to express need for assistance with ADLs?: Yes Independently performs ADLs?: Yes (appropriate for developmental age)  Prior Inpatient Therapy Prior Inpatient Therapy: Yes Prior Therapy Dates: unknown Prior Therapy Facilty/Provider(s): unknown Reason for Treatment: depression  Prior Outpatient Therapy Prior Outpatient Therapy: No Prior Therapy Dates: na Prior Therapy Facilty/Provider(s): na Reason for Treatment: na Does patient have an ACCT team?: No Does patient have Intensive In-House Services?  : No Does patient have [facility] services? : No Does patient have P4CC services?: No  ADL Screening (condition at time of  admission) Patient's cognitive ability adequate to safely complete daily activities?: Yes Patient able to express need for assistance with ADLs?: Yes Independently performs ADLs?: Yes (appropriate for developmental age)       Abuse/Neglect  Assessment (Assessment to be complete while patient is alone) Abuse/Neglect Assessment Can Be Completed: Yes Physical Abuse: Denies Verbal Abuse: Denies Sexual Abuse: Denies Exploitation of patient/patient's resources: Denies Self-Neglect: Denies Values / Beliefs Cultural Requests During Hospitalization: None Spiritual Requests During Hospitalization: None Consults Spiritual Care Consult Needed: No Social Work Consult Needed: No Merchant navy officer (For Healthcare) Does Patient Have a Medical Advance Directive?: No    Additional Information 1:1 In Past 12 Months?: No CIRT Risk: No Elopement Risk: No Does patient have medical clearance?: Yes     Disposition:  Disposition Initial Assessment Completed for this Encounter: Yes Disposition of Patient: Inpatient treatment program Type of inpatient treatment program: Adult  On Site Evaluation by:   Reviewed with Physician:    [place] 11/15/2017 5:28 AM

## 2017-11-15 NOTE — ED Notes (Signed)
Report called to Margaret, RN in the ED [facility].  

## 2017-11-15 NOTE — ED Notes (Signed)

## 2017-11-15 NOTE — ED Notes (Signed)

## 2017-11-15 NOTE — ED Notes (Signed)
PT IVC/SOC COMPLETE/SOC RECOMMENDS INPATIENT ADMISSION.

## 2017-11-15 NOTE — ED Provider Notes (Signed)
[facility] Emergency Department Provider Note   ____________________________________________   First MD Initiated Contact with Patient 11/15/17 0139     (approximate)  I have reviewed the triage vital signs and the nursing notes.   HISTORY  Chief Complaint Psychiatric Evaluation    HPI Linda Tyler is a 36 y.o. female brought to the ED from home by her husband with a chief complaint of auditory hallucinations, depression and suicidal ideation.  Has been told triage nurse that patient tried to kill herself with a gun several weeks ago.  Prescribed Paxil and Wellbutrin without relief of symptoms.  Complains of gradual onset mild global headache from crying.  Otherwise voices no medical complaints.   Past Medical History:  Diagnosis Date  . Abnormal uterine bleeding (AUB)   . Cellulitis   . Irritable bowel syndrome with constipation 10/2016  . LV dysfunction    EF 45-50% by 2-D echo in the past  . Skin rash     Patient Active Problem List   Diagnosis Date Noted  . S/P hysterectomy 11/24/2016  . LV dysfunction 07/24/2014  . Hypokalemia 06/24/2012  . Skin rash 06/23/2012  . Allergic drug rash 06/23/2012  . Cellulitis 06/23/2012  . Tick bite 06/23/2012  . Syncope and collapse 06/23/2012  . Chest pain on breathing 06/23/2012  . Dehydration 06/23/2012    Past Surgical History:  Procedure Laterality Date  . dental extra  2016  . LAPAROSCOPIC VAGINAL HYSTERECTOMY WITH SALPINGECTOMY N/A 11/24/2016   Procedure: LAPAROSCOPIC ASSISTED VAGINAL HYSTERECTOMY WITH BILATERAL SALPINGECTOMY, LEFT OOPHERECTOMY;  Surgeon: Mitchel HonourMegan Morris, DO;  Location: [facility];  Service: Gynecology;  Laterality: N/A;  . TRANSTHORACIC ECHOCARDIOGRAM  06/25/2012   EF 45% TO 50%. RV- CAVITY SIZE MODERATELY DECREASED.  Marland Kitchen. TUBAL LIGATION  2007    Prior to Admission medications   Medication Sig Start Date End Date Taking? Authorizing Provider  b complex  vitamins capsule Take 1 capsule by mouth daily.    [provider]  buPROPion (WELLBUTRIN XL) 150 MG 24 hr tablet Take 150 mg by mouth 2 (two) times daily.    [provider]  gabapentin (NEURONTIN) 300 MG capsule Take 300 mg by mouth 3 (three) times daily.    [provider]  ibuprofen (ADVIL,MOTRIN) 800 MG tablet Take 1 tablet (800 mg total) by mouth every 6 (six) hours as needed. 11/25/16   Mitchel HonourMorris, Megan, DO  linaclotide (LINZESS) 290 MCG CAPS capsule Take 290 mcg by mouth daily before breakfast.    [provider]  Multiple Vitamin (MULTIVITAMIN) tablet Take 1 tablet by mouth daily.    [provider]  Multiple Vitamins-Minerals (VITA HAIR) TABS Take by mouth daily. Skin,hair,nail vitamin    [provider]  oxyCODONE-acetaminophen (PERCOCET/ROXICET) 5-325 MG tablet Take 1-2 tablets by mouth every 4 (four) hours as needed (moderate to severe pain (when tolerating fluids)). 11/25/16   Mitchel HonourMorris, Megan, DO    Allergies Cephalexin; Coconut flavor; Sulfa antibiotics; Penicillins; and Aspirin  Family History  Problem Relation Age of Onset  . Diabetes Mother   . Heart failure Mother   . Heart disease Mother        died in her 4850s with a massive MI  . Heart failure Father   . Hypertension Father     Social History Social History   Tobacco Use  . Smoking status: Former Smoker    Packs/day: 0.00    Types: Cigarettes    Last attempt to quit: 11/19/2013  Years since quitting: 3.9  . Smokeless tobacco: Never Used  . Tobacco comment: rare cigar at present  Substance Use Topics  . Alcohol use: No  . Drug use: No    Review of Systems  Constitutional: No fever/chills.  Eyes: No visual changes. ENT: No sore throat. Cardiovascular: Denies chest pain. Respiratory: Denies shortness of breath. Gastrointestinal: No abdominal pain.  No nausea, no vomiting.  No diarrhea.  No constipation. Genitourinary: Negative for  dysuria. Musculoskeletal: Negative for back pain. Skin: Negative for rash. Neurological: Positive for headache. Negative for focal weakness or numbness. Psychiatric:Positive for depression, auditory hallucinations and suicidal ideation.  ____________________________________________   PHYSICAL EXAM:  VITAL SIGNS: ED Triage Vitals  Enc Vitals Group     BP 11/15/17 0054 (!) 104/99     Pulse Rate 11/15/17 0054 (!) 150     Resp 11/15/17 0054 20     Temp 11/15/17 0054 98.3 F (36.8 C)     Temp Source 11/15/17 0054 Oral     SpO2 11/15/17 0054 98 %     Weight 11/15/17 0055 130 lb (59 kg)     Height 11/15/17 0055 5\' 7"  (1.702 m)     Head Circumference --      Peak Flow --      Pain Score --      Pain Loc --      Pain Edu? --      Excl. in GC? --     Constitutional: Alert and oriented. Well appearing and in mild acute distress.  Tearful and scared. Eyes: Conjunctivae are normal. PERRL. EOMI. Head: Atraumatic. Nose: No congestion/rhinnorhea. Mouth/Throat: Mucous membranes are moist.  Oropharynx non-erythematous. Neck: No stridor.   Cardiovascular: Normal rate, regular rhythm. Grossly normal heart sounds.  Good peripheral circulation. Respiratory: Normal respiratory effort.  No retractions. Lungs CTAB. Gastrointestinal: Soft and nontender. No distention. No abdominal bruits. No CVA tenderness. Musculoskeletal: No lower extremity tenderness nor edema.  No joint effusions. Neurologic:  Normal speech and language. No gross focal neurologic deficits are appreciated. No gait instability. Skin:  Skin is warm, dry and intact. No rash noted. Psychiatric: Mood and affect are tearful. Speech and behavior are normal.  ____________________________________________   LABS (all labs ordered are listed, but only abnormal results are displayed)  Labs Reviewed  COMPREHENSIVE METABOLIC PANEL - Abnormal; Notable for the following components:      Result Value   CO2 20 (*)    Glucose, Bld 107  (*)    Total Bilirubin <0.1 (*)    All other components within normal limits  ETHANOL - Abnormal; Notable for the following components:   Alcohol, Ethyl (B) 135 (*)    All other components within normal limits  ACETAMINOPHEN LEVEL - Abnormal; Notable for the following components:   Acetaminophen (Tylenol), Serum <10 (*)    All other components within normal limits  SALICYLATE LEVEL  CBC  URINE DRUG SCREEN, QUALITATIVE ([facility] ONLY)  POC URINE PREG, ED  POCT PREGNANCY, URINE   ____________________________________________  EKG  None ____________________________________________  RADIOLOGY  No results found.  ____________________________________________   PROCEDURES  Procedure(s) performed: None  Procedures  Critical Care performed: No  ____________________________________________   INITIAL IMPRESSION / ASSESSMENT AND PLAN / ED COURSE  As part of my medical decision making, I reviewed the following data within the electronic MEDICAL RECORD NUMBER History obtained from family, Nursing notes reviewed and incorporated, Labs reviewed, A consult was requested and obtained from this/these consultant(s) Psychiatry and Notes from prior  ED visits.   36 year old female who presents with auditory hallucinations, paranoia and suicidal ideation.  She is tearful and scared.  Will place patient under involuntary commitment for her safety.  Will consult TTS and [facility] psychiatry for evaluation.  Clinical Course as of Nov 15 545  Sun Nov 15, 2017  0308 Patient evaluated by [facility] psychiatrist Dr. Jonelle Sidle who recommends inpatient hospitalization.  Medication recommendations ordered.  [JS]    Clinical Course User Index [JS] Irean Hong, MD     ____________________________________________   FINAL CLINICAL IMPRESSION(S) / ED DIAGNOSES  Final diagnoses:  Moderate episode of recurrent major depressive disorder (HCC)  Alcoholic intoxication without complication [facility])     ED Discharge  Orders    None       Note:  This document was prepared using Dragon voice recognition software and may include unintentional dictation errors.    Irean Hong, MD 11/15/17 [id number]

## 2017-11-15 NOTE — ED Notes (Signed)
Pt returned to 19H 

## 2017-11-15 NOTE — ED Notes (Addendum)
Dressed Pt in behavorial scrubs. Pt had coat, leggings, tank top, and slippers. Patient had cell phone and two sealed envelopes only.  No jewelry.AS

## 2017-11-15 NOTE — ED Notes (Addendum)
Pt gave this RN verbal permission to speak with her husband and to give him her passcode for the ED.   BEHAVIORAL HEALTH ROUNDING  Patient sleeping: No.  Patient alert and oriented: yes  Behavior appropriate: Yes. ; If no, describe:  Nutrition and fluids offered: Yes  Toileting and hygiene offered: Yes  Sitter present: not applicable, Q 15 min safety rounds and observation.  Law enforcement present: Yes ODS

## 2017-11-15 NOTE — ED Notes (Signed)
Stephanie ed tech is one on one sitting with pt at this time.

## 2017-11-15 NOTE — Progress Notes (Signed)
D: Received patient from ED. Patient skin assessment completed with Brayton CavesJessie RN, skin is intact with some skin flakes on the foot, no contraband found. Pt. Was admitted under the services of Dr. Jennet MaduroPucilowska. Pt items prohibited on the unit locked up (see belongings sheet).    A: Patient oriented to unit/room/call light.Patient was encourage to participate in unit activities and continue with plan of care. Q x 15 minute observation checks were completed for safety.   R: Patient is receptive to treatment and safety maintained on unit.

## 2017-11-15 NOTE — ED Notes (Signed)
Report was received from Franki CabotBarbara B., RN; Pt. Verbalizes  complaints of Depression; distress of having a/v hallucinations; verbalizes having S.I.; and denies having Hi. Continue to monitor with 15 min. Monitoring.

## 2017-11-15 NOTE — ED Notes (Signed)
Patient has been in bed most of day. Patient visited with husband today. Patient voices no concerns. Patient took a shower and took medications as ordered today. Patient voices no concern. Patient remains safe on unit and Q 15 minute checks continues for safety. Patient given dinner tray at this time.

## 2017-11-15 NOTE — ED Notes (Signed)
This tech sitting at pt bedside at this time, this tech will continue to monitor pt

## 2017-11-16 ENCOUNTER — Encounter: Payer: Self-pay | Admitting: Psychiatry

## 2017-11-16 ENCOUNTER — Inpatient Hospital Stay: Payer: BLUE CROSS/BLUE SHIELD

## 2017-11-16 DIAGNOSIS — R45851 Suicidal ideations: Secondary | ICD-10-CM

## 2017-11-16 DIAGNOSIS — F1020 Alcohol dependence, uncomplicated: Secondary | ICD-10-CM | POA: Diagnosis present

## 2017-11-16 DIAGNOSIS — F333 Major depressive disorder, recurrent, severe with psychotic symptoms: Principal | ICD-10-CM

## 2017-11-16 LAB — LIPID PANEL
Cholesterol: 175 mg/dL (ref 0–200)
HDL: 66 mg/dL (ref >40)
LDL Cholesterol: 87 mg/dL (ref 0–99)
Total CHOL/HDL Ratio: 2.7 RATIO
Triglycerides: 112 mg/dL (ref <150)
VLDL: 22 mg/dL (ref 0–40)

## 2017-11-16 LAB — TSH: TSH: 2.84 u[IU]/mL (ref 0.350–4.500)

## 2017-11-16 LAB — HCG, QUANTITATIVE, PREGNANCY: hCG, Beta Chain, Quant, S: 1 m[IU]/mL (ref <5)

## 2017-11-16 MED ORDER — SUMATRIPTAN SUCCINATE 50 MG PO TABS
50.0000 mg | ORAL_TABLET | ORAL | Status: DC | PRN
  Administered 2017-11-18: 50 mg via ORAL
  Filled 2017-11-16: qty 1

## 2017-11-16 MED ORDER — OLANZAPINE 10 MG PO TABS
10.0000 mg | ORAL_TABLET | Freq: Every day | ORAL | Status: DC
  Administered 2017-11-16 – 2017-11-18 (×3): 10 mg via ORAL
  Filled 2017-11-16 (×3): qty 1

## 2017-11-16 MED ORDER — CHLORDIAZEPOXIDE HCL 25 MG PO CAPS
25.0000 mg | ORAL_CAPSULE | Freq: Four times a day (QID) | ORAL | Status: AC
  Administered 2017-11-16 – 2017-11-19 (×12): 25 mg via ORAL
  Filled 2017-11-16 (×12): qty 1

## 2017-11-16 MED ORDER — NICOTINE 21 MG/24HR TD PT24
21.0000 mg | MEDICATED_PATCH | Freq: Every day | TRANSDERMAL | Status: DC
  Administered 2017-11-16 – 2017-11-23 (×8): 21 mg via TRANSDERMAL
  Filled 2017-11-16 (×8): qty 1

## 2017-11-16 MED ORDER — TEMAZEPAM 15 MG PO CAPS
30.0000 mg | ORAL_CAPSULE | Freq: Every evening | ORAL | Status: DC | PRN
  Administered 2017-11-16: 30 mg via ORAL
  Filled 2017-11-16: qty 2

## 2017-11-16 MED ORDER — FLUVOXAMINE MALEATE 50 MG PO TABS
50.0000 mg | ORAL_TABLET | Freq: Every day | ORAL | Status: DC
  Administered 2017-11-16: 50 mg via ORAL
  Filled 2017-11-16: qty 1

## 2017-11-16 MED ORDER — CHLORDIAZEPOXIDE HCL 25 MG PO CAPS: 25.0000 mg | ORAL_CAPSULE | Freq: Four times a day (QID) | ORAL | Status: DC

## 2017-11-16 MED ORDER — ADULT MULTIVITAMIN W/MINERALS CH
1.0000 | ORAL_TABLET | Freq: Every day | ORAL | Status: DC
  Administered 2017-11-16 – 2017-11-23 (×8): 1 via ORAL
  Filled 2017-11-16 (×8): qty 1

## 2017-11-16 MED ORDER — TRAZODONE HCL 100 MG PO TABS
100.0000 mg | ORAL_TABLET | Freq: Every evening | ORAL | Status: DC | PRN
  Administered 2017-11-16: 100 mg via ORAL
  Filled 2017-11-16: qty 1

## 2017-11-16 MED ORDER — ENSURE ENLIVE PO LIQD
237.0000 mL | Freq: Two times a day (BID) | ORAL | Status: DC
  Administered 2017-11-16 – 2017-11-22 (×13): 237 mL via ORAL

## 2017-11-16 MED ORDER — MAGNESIUM CITRATE PO SOLN
1.0000 | Freq: Once | ORAL | Status: AC
  Administered 2017-11-16: 1 via ORAL
  Filled 2017-11-16 (×2): qty 296

## 2017-11-16 NOTE — Tx Team (Addendum)
Interdisciplinary Treatment and Diagnostic Plan Update  11/16/2017 Time of Session: 11:33 AM  Linda Tyler MRN: [id number]  Principal Diagnosis: Severe recurrent major depressive disorder with psychotic features (HCC)  Secondary Diagnoses: Principal Problem:   Severe recurrent major depressive disorder with psychotic features (HCC) Active Problems:   Alcohol use disorder, moderate, dependence (HCC)   Suicidal ideation   Current Medications:  Current Facility-Administered Medications  Medication Dose Route Frequency Provider Last Rate Last Dose  . acetaminophen (TYLENOL) tablet 650 mg  650 mg Oral Q6H PRN Cindee LameIsbell, Lauren M, MD      . alum & mag hydroxide-simeth (MAALOX/MYLANTA) 200-200-20 MG/5ML suspension 30 mL  30 mL Oral Q4H PRN Cindee LameIsbell, Lauren M, MD      . gabapentin (NEURONTIN) capsule 300 mg  300 mg Oral TID Cindee LameIsbell, Lauren M, MD   300 mg at 11/16/17 0813  . ibuprofen (ADVIL,MOTRIN) tablet 800 mg  800 mg Oral Q6H PRN Cindee LameIsbell, Lauren M, MD      . linaclotide Karlene Einstein(LINZESS) capsule 290 mcg  290 mcg Oral QAC breakfast Cindee LameIsbell, Lauren M, MD   290 mcg at 11/16/17 0813  . magnesium hydroxide (MILK OF MAGNESIA) suspension 30 mL  30 mL Oral Daily PRN Cindee LameIsbell, Lauren M, MD      . traZODone (DESYREL) tablet 100 mg  100 mg Oral QHS PRN Cindee LameIsbell, Lauren M, MD   100 mg at 11/16/17 0133    PTA Medications: Medications Prior to Admission  Medication Sig Dispense Refill Last Dose  . b complex vitamins capsule Take 1 capsule by mouth daily.   Past Week at Unknown time  . buPROPion (WELLBUTRIN XL) 150 MG 24 hr tablet Take 150 mg by mouth 2 (two) times daily.   Past Week at Unknown time  . gabapentin (NEURONTIN) 300 MG capsule Take 300 mg by mouth 3 (three) times daily.   Past Week at Unknown time  . ibuprofen (ADVIL,MOTRIN) 800 MG tablet Take 1 tablet (800 mg total) by mouth every 6 (six) hours as needed. 30 tablet 1   . linaclotide (LINZESS) 290 MCG CAPS capsule Take 290 mcg by mouth daily before  breakfast.   11/23/2016 at Unknown time  . Multiple Vitamin (MULTIVITAMIN) tablet Take 1 tablet by mouth daily.   Past Week at Unknown time  . Multiple Vitamins-Minerals (VITA HAIR) TABS Take by mouth daily. Skin,hair,nail vitamin   Past Week at Unknown time  . oxyCODONE-acetaminophen (PERCOCET/ROXICET) 5-325 MG tablet Take 1-2 tablets by mouth every 4 (four) hours as needed (moderate to severe pain (when tolerating fluids)). 30 tablet 0     Patient Stressors: Health problems Medication change or noncompliance Substance abuse Traumatic event Other: hallucinations  Patient Strengths: Ability for insight Active sense of humor Communication skills General fund of knowledge Motivation for treatment/growth Supportive family/friends  Treatment Modalities: Medication Management, Group therapy, Case management,  1 to 1 session with clinician, Psychoeducation, Recreational therapy.   Physician Treatment Plan for Primary Diagnosis: Severe recurrent major depressive disorder with psychotic features (HCC) Long Term Goal(s): Improvement in symptoms so as ready for discharge  Short Term Goals:    Medication Management: Evaluate patient's response, side effects, and tolerance of medication regimen.  Therapeutic Interventions: 1 to 1 sessions, Unit Group sessions and Medication administration.  Evaluation of Outcomes: Progressing  Physician Treatment Plan for Secondary Diagnosis: Principal Problem:   Severe recurrent major depressive disorder with psychotic features (HCC) Active Problems:   Alcohol use disorder, moderate, dependence (HCC)   Suicidal ideation  Long Term Goal(s): Improvement in symptoms so as ready for discharge  Short Term Goals:    Medication Management: Evaluate patient's response, side effects, and tolerance of medication regimen.  Therapeutic Interventions: 1 to 1 sessions, Unit Group sessions and Medication administration.  Evaluation of Outcomes:  Progressing   RN Treatment Plan for Primary Diagnosis: Severe recurrent major depressive disorder with psychotic features (HCC) Long Term Goal(s): Knowledge of disease and therapeutic regimen to maintain health will improve  Short Term Goals: Ability to remain free from injury will improve, Ability to participate in decision making will improve, Ability to disclose and discuss suicidal ideas, Ability to identify and develop effective coping behaviors will improve and Compliance with prescribed medications will improve  Medication Management: RN will administer medications as ordered by provider, will assess and evaluate patient's response and provide education to patient for prescribed medication. RN will report any adverse and/or side effects to prescribing provider.  Therapeutic Interventions: 1 on 1 counseling sessions, Psychoeducation, Medication administration, Evaluate responses to treatment, Monitor vital signs and CBGs as ordered, Perform/monitor CIWA, COWS, AIMS and Fall Risk screenings as ordered, Perform wound care treatments as ordered.  Evaluation of Outcomes: Progressing   LCSW Treatment Plan for Primary Diagnosis: Severe recurrent major depressive disorder with psychotic features (HCC) Long Term Goal(s): Safe transition to appropriate next level of care at discharge, Engage patient in therapeutic group addressing interpersonal concerns.  Short Term Goals: Engage patient in aftercare planning with referrals and resources, Increase ability to appropriately verbalize feelings, Increase emotional regulation, Facilitate patient progression through stages of change regarding substance use diagnoses and concerns and Increase skills for wellness and recovery  Therapeutic Interventions: Assess for all discharge needs, 1 to 1 time with Social worker, Explore available resources and support systems, Assess for adequacy in community support network, Educate family and significant other(s) on  suicide prevention, Complete Psychosocial Assessment, Interpersonal group therapy.  Evaluation of Outcomes: Progressing   Progress in Treatment: Attending groups: Yes Participating in groups: Yes Taking medication as prescribed: Yes Toleration medication: Yes, no side effects reported at this time Family/Significant other contact made: Yes, Husband Patient understands diagnosis: Yes AEB Discussing patient identified problems/goals with staff: Yes Medical problems stabilized or resolved: Yes Denies suicidal/homicidal ideation: Yes Issues/concerns per patient self-inventory: None Other: N/A  New problem(s) identified: None identified at this time.   New Short Term/Long Term Goal(s):"I know that I need help"  Discharge Plan or Barriers: Return home to husband and attend services for medications management.   Reason for Continuation of Hospitalization: Anxiety  Depression Medication stabilization   Estimated Length of Stay: 7 days   Recreational Therapy: Patient Stressors: (Depression, Anxiety) Patient Goal: Coping Skills - Patient will be able to identify at least 5 coping skills for depression by conclusion of recreation therapy tx    Attendees: Patient: Linda Tyler 11/16/2017  11:33 AM Physician: Kristine LineaJolanta Pucilowska, MD         11/16/2017  11:33 AM Nursing: Marjo Bickerhristy Richards, RN           11/16/2017  11:33 AM RN Care Manager:     11/16/2017  11:33 AM Social Worker: Johny Shearsassandra Jarrett LCSWA           11/16/2017  11:33 AM Recreational Therapist: Bunnie PhilipsShay O, CTRS, LRT        11/16/2017  11:33 AM Other:      11/16/2017  11:33 AM Other:   11/16/2017  11:33 AM   Scribe for Treatment Team: Johny Shearsassandra Jarrett LCSWA 11/16/2017  11:33 AM
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
## 2017-11-16 NOTE — Plan of Care (Signed)
Patient up on the unit. She is quite and tearful at times. She denies si,  hi, avh. But does appear sad. She answers briefly and briefly has eye contact.  No behavior issues noted. Will continue to monitor.

## 2017-11-16 NOTE — [facility] Suicide Risk Assessment (Signed)
[facility] INPATIENT:  Family/Significant Other Suicide Prevention Education  Suicide Prevention Education:  Education Completed; Linda SacramentoBrad Tyler, Husband, [phone],has been identified by the patient as the family member/significant other with whom the patient will be residing, and identified as the person(s) who will aid the patient in the event of a mental health crisis (suicidal ideations/suicide attempt).  With written consent from the patient, the family member/significant other has been provided the following suicide prevention education, prior to the and/or following the discharge of the patient.  The suicide prevention education provided includes the following:  Suicide risk factors  Suicide prevention and interventions  National Suicide Hotline telephone number  [facility] assessment telephone number  [facility] Emergency Assistance 911  [facility] and/or Residential Mobile Crisis Unit telephone number  Request made of family/significant other to:  Remove weapons (e.g., guns, rifles, knives), all items previously/currently identified as safety concern.    Remove drugs/medications (over-the-counter, prescriptions, illicit drugs), all items previously/currently identified as a safety concern.  The family member/significant other verbalizes understanding of the suicide prevention education information provided.  The family member/significant other agrees to remove the items of safety concern listed above.  Husband told social worker that he removed all guns/weapons from the home. Linda ShearsCassandra  Deonna Tyler 11/16/2017, 11:31 AM

## 2017-11-16 NOTE — [facility] Suicide Risk Assessment (Signed)
[facility]   Nursing information obtained from:    Demographic factors:    Current Mental Status:    Loss Factors:    Historical Factors:    Risk Reduction Factors:     Total Time spent with patient: 1 hour Principal Problem: Severe recurrent major depressive disorder with psychotic features [facility](HCC) Diagnosis:   Patient Active Problem List   Diagnosis Date Noted  . Alcohol use disorder, moderate, dependence (HCC) [F10.20] 11/16/2017  . Suicidal ideation [R45.851] 11/16/2017  . Severe recurrent major depressive disorder with psychotic features (HCC) [F33.3] 11/15/2017  . S/P hysterectomy [Z90.710] 11/24/2016  . LV dysfunction [I51.9] 07/24/2014  . Hypokalemia [E87.6] 06/24/2012  . Skin rash [R21] 06/23/2012  . Allergic drug rash [L27.0] 06/23/2012  . Cellulitis [L03.90] 06/23/2012  . Tick bite [id number][W57.XXXA] 06/23/2012  . Syncope and collapse [R55] 06/23/2012  . Chest pain on breathing [R07.1] 06/23/2012  . Dehydration [E86.0] 06/23/2012   Subjective Data: suicidal ideation  Continued Clinical Symptoms:  Alcohol Use Disorder Identification Test Final Score (AUDIT): 36 The "Alcohol Use Disorders Identification Test", Guidelines for Use in Primary Care, Second Edition.  World Science writerHealth Organization [facility](WHO). Score between 0-7:  no or low risk or alcohol related problems. Score between 8-15:  moderate risk of alcohol related problems. Score between 16-19:  high risk of alcohol related problems. Score 20 or above:  warrants further diagnostic evaluation for alcohol dependence and treatment.   CLINICAL FACTORS:   Depression:   Comorbid alcohol abuse/dependence Impulsivity Insomnia Alcohol/Substance Abuse/Dependencies Currently Psychotic Previous Psychiatric Diagnoses and Treatments   Musculoskeletal: Strength & Muscle Tone: within normal limits Gait & Station: normal Patient leans: N/A  Psychiatric Specialty Exam: Physical Exam  Nursing note and vitals  reviewed. Psychiatric: Her mood appears anxious. Her affect is blunt. Her speech is delayed. She is slowed, withdrawn and actively hallucinating. Thought content is paranoid and delusional. Cognition and memory are normal. She expresses impulsivity. She exhibits a depressed mood.    Review of Systems  Constitutional: Positive for malaise/fatigue.  Cardiovascular: Positive for palpitations.  Neurological: Positive for tremors and headaches.  Psychiatric/Behavioral: Positive for depression, hallucinations, substance abuse and suicidal ideas. The patient is nervous/anxious and has insomnia.   All other systems reviewed and are negative.   Blood pressure 112/86, pulse (!) 119, temperature 98.3 F (36.8 C), temperature source Oral, resp. rate 18, height 5\' 6"  (1.676 m), weight 60.3 kg (133 lb), last menstrual period 11/13/2016, SpO2 99 %.Body mass index is 21.47 kg/m.  General Appearance: Casual  Eye Contact:  Minimal  Speech:  Slow  Volume:  Decreased  Mood:  Anxious, Depressed and Hopeless  Affect:  Blunt  Thought Process:  Goal Directed and Descriptions of Associations: Intact  Orientation:  Full (Time, Place, and Person)  Thought Content:  Delusions, Hallucinations: Auditory Visual and Paranoid Ideation  Suicidal Thoughts:  Yes.  without intent/plan  Homicidal Thoughts:  No  Memory:  Immediate;   Fair Recent;   Fair Remote;   Fair  Judgement:  Poor  Insight:  Lacking  Psychomotor Activity:  Psychomotor Retardation  Concentration:  Concentration: Fair and Attention Span: Fair  Recall:  [employer]  Fund of Knowledge:  Fair  Language:  Fair  Akathisia:  No  Handed:  Right  AIMS (if indicated):     Assets:  Communication Skills Desire for Improvement Housing Intimacy Physical Health Resilience Social Support  ADL's:  Intact  Cognition:  WNL  Sleep:  COGNITIVE FEATURES THAT CONTRIBUTE TO RISK:  None    SUICIDE RISK:   Moderate:  Frequent suicidal ideation with  limited intensity, and duration, some specificity in terms of plans, no associated intent, good self-control, limited dysphoria/symptomatology, some risk factors present, and identifiable protective factors, including available and accessible social support.  PLAN OF CARE: hospital admission, medication management, substance abuse counseling, discharge planning.  Ms. [name] is a 36 year old female with a history of untreated psychosis for about one year admitted for worsening of her symptoms.   #Psychosis -start Zyprexa 10 gm nightly  -head CT scan  #Depression/anxiety -start Luvox 50 mg nightly  #Insomnia -start Temazepam 30 mg nightly PRN  #Poor oral intake due to paranoia -Ensure TID -order paranoid tray  #Metabolic syndrome monitoring -Lipid panel, TSH and HgbA1C are pending -EKG, pending -pregnancy test is negative  #Alcohol abuse -start Librium taper -Neurontin 300 gm TID  #Constipation -Linzess daily -Mag citrate once  #Headaches -CT scan -Imitrex PRN  #Disposition -Discharge to home with the husband -follow up with [facility]    I certify that inpatient services furnished can reasonably be expected to improve the patient's condition.   Kristine LineaJolanta Adonnis Salceda, MD 11/16/2017, 12:52 PM

## 2017-11-16 NOTE — H&P (Deleted)
[facility],appeared depressed and calm but denies having any suicidal thoughts and ideas, her affect is flat, there are no signes of hallucination , dilussions or bizarre behaviors, associations are intact, thinking is logical and thought content is appropriate. Patient is stable and responding well and participating in assessment processes. Patient reports that she has [facility] that lead to decrease in anxiety and depression at this time. Skin assessment is complete with two RNs present Alex/Jessie noted. Intact skin, no weapon found with patient, patient contract for safety of self and others, no respiratory distress noted.

## 2017-11-16 NOTE — [facility] Group Notes (Signed)
[facility] Group Notes:  (Nursing/MHT/Case Management/Adjunct)  Date:  11/16/2017  Time:  2:52 PM  Type of Therapy:  Psychoeducational Skills  Participation Level:  Active  Participation Quality:  Appropriate, Attentive, Sharing and Supportive  Affect:  Appropriate and Flat  Cognitive:  Alert, Appropriate and Oriented  Insight:  Good  Engagement in Group:  Developing/Improving and Engaged  Modes of Intervention:  Discussion and Education  Summary of Progress/Problems:  Linda Farberamela [name] 11/16/2017, 2:52 PM

## 2017-11-16 NOTE — Progress Notes (Signed)
Recreation Therapy Notes  Date: 12.03.2018  Time: 9:30 am  Location: Craft Room  Behavioral response: N/A  Intervention Topic: Emotions  Discussion/Intervention: Patient did not attend group.  Clinical Observations/Feedback:  Patient did not attend group. Avina Eberle LRT/CTRS         Diogo Anne 11/16/2017 12:40 PM
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
## 2017-11-16 NOTE — Progress Notes (Signed)
D:Pt denies current suicidal and homicidal ideation during assessment this evening. Pt. Is able to verbally contract for safety with this Clinical research associatewriter. Pt. Upon interaction is very guarded, anxious, and tearful,  but cooperative with answering admissions questions and is open about her problems. Pt during assessment reports auditory and visual hallucinations and is able to give descriptions of both that she experiences. Pt reports, "I see yellow stars and strips on walls moving around and a man that follows me" and "I hear voices that say very bad things about me and things that are upsetting". Pt reports excessive alcohol utilization prior to admissions as apart of her HX.   A: Q x 15 minute observation checks were completed for safety. Patient was provided with education. Patient was given oral PRN medications to help with sleep this evening. Patient  was encourage to attend groups, participate in unit activities and continue with plan of care during her stay.    R:Patient is complaint with medication and unit procedures. Pt. After admission went to her room and rested for the remainder of the night.            Patient slept for Estimated Hours of 3; Precautionary checks every 15 minutes for safety maintained, room free of safety hazards, patient sustains no injury or falls during this shift.

## 2017-11-16 NOTE — Progress Notes (Signed)
Nutrition Brief Note  Patient identified on the Malnutrition Screening Tool (MST) Report  36 y.o. female brought to the ED from home by her husband with a chief complaint of auditory hallucinations, depression and suicidal ideation.   Wt Readings from Last 15 Encounters:  11/15/17 133 lb (60.3 kg)  11/15/17 130 lb (59 kg)  11/24/16 156 lb (70.8 kg)  07/24/14 159 lb (72.1 kg)  06/23/12 149 lb 11.1 oz (67.9 kg)    Body mass index is 21.47 kg/m. Patient meets criteria for normal weight based on current BMI.   Current diet order is regular, patient is consuming approximately 25-100% of meals at this time. Labs and medications reviewed.   RD will order Ensure Enlive po BID, each supplement provides 350 kcal and 20 grams of protein  No further nutrition interventions warranted at this time. If nutrition issues arise, please consult RD.   Betsey Holidayasey Katena Petitjean MS, RD, LDN Pager #- [phone] After Hours Pager: [phone]

## 2017-11-17 LAB — HEMOGLOBIN A1C
HEMOGLOBIN A1C: 5.1 % (ref 4.8–5.6)
MEAN PLASMA GLUCOSE: 100 mg/dL

## 2017-11-17 MED ORDER — TEMAZEPAM 15 MG PO CAPS: 15.0000 mg | ORAL_CAPSULE | Freq: Every evening | ORAL | Status: DC | PRN

## 2017-11-17 MED ORDER — FLUVOXAMINE MALEATE 50 MG PO TABS
100.0000 mg | ORAL_TABLET | Freq: Every day | ORAL | Status: DC
  Administered 2017-11-17 – 2017-11-22 (×6): 100 mg via ORAL
  Filled 2017-11-17 (×6): qty 2

## 2017-11-17 MED ORDER — CHLORPROMAZINE HCL 50 MG PO TABS
50.0000 mg | ORAL_TABLET | Freq: Four times a day (QID) | ORAL | Status: DC | PRN
  Filled 2017-11-17: qty 1

## 2017-11-17 NOTE — Progress Notes (Signed)
Patient verbalized that she is having lot of anger towards her biological parents inside and at times she feels like burst out.Patient isolated in the room.Verbalized that he has social anxiety.Patient denies SI,HI and AVH.Appropriate with staff & peers.Compliant with medications.Attended groups.Appetite and energy level good.Encouraged patient to verbalize her feelings.Support and encouragement given.

## 2017-11-17 NOTE — Progress Notes (Signed)
Recreation Therapy Notes  INPATIENT RECREATION THERAPY ASSESSMENT  Patient Details Name: Linda Tyler MRN: [id number] DOB: Feb 12, 1981 Today's Date: 11/17/2017  Patient Stressors: Other (Comment)(Depression, Anxiety)  Coping Skills:   Isolate, Avoidance  Personal Challenges: Anger, Communication, Concentration, Decision-Making, Expressing Yourself, Problem-Solving, Social Interaction, Self-Esteem/Confidence, Substance Abuse, Time Management  Leisure Interests (2+):  (Clean)  Awareness of Community Resources:  No  Community Resources:     Current Use:    If no, Barriers?:    Patient Strengths:  None  Patient Identified Areas of Improvement:  Everything  Current Recreation Participation:  N/A  Patient Goal for Hospitalization:  Get better and be a good wife and daughter.  City of Residence:  [place]  County [place] Residence:  [place]   Current [place] (including self-harm):  No  Current HI:  No  Consent to Intern Participation: N/A   [name] 11/17/2017, 11:12 AM

## 2017-11-17 NOTE — Plan of Care (Signed)
Patient is encouraged to verbalize her feelings.

## 2017-11-17 NOTE — Progress Notes (Signed)
Recreation Therapy Notes  Date: 12.04.2018  Time: 9:30 am  Location: Craft Room  Behavioral response: N/A  Intervention Topic: Goals  Discussion/Intervention: Patient did not attend group. Clinical Observations/Feedback:  Patient did not attend group. Helena Sardo LRT/CTRS          Linda Tyler 11/17/2017 11:04 AM 

## 2017-11-17 NOTE — [facility] Group Notes (Signed)
Goals Group Date/Time: 11/17/2017 9:00 AM Type of Therapy and Topic: Group Therapy: Goals Group: SMART Goals   Participation Level: Moderate  Description of Group:    The purpose of a daily goals group is to assist and guide patients in setting recovery/wellness-related goals. The objective is to set goals as they relate to the crisis in which they were admitted. Patients will be using SMART goal modalities to set measurable goals. Characteristics of realistic goals will be discussed and patients will be assisted in setting and processing how one will reach their goal. Facilitator will also assist patients in applying interventions and coping skills learned in psycho-education groups to the SMART goal and process how one will achieve defined goal.   Therapeutic Goals:   -Patients will develop and document one goal related to or their crisis in which brought them into treatment.  -Patients will be guided by LCSW using SMART goal setting modality in how to set a measurable, attainable, realistic and time sensitive goal.  -Patients will process barriers in reaching goal.  -Patients will process interventions in how to overcome and successful in reaching goal.   Patient's Goal:To try to forgive--talk to someone about event in her life that she is struggling to forgive people for.   Therapeutic Modalities:  Motivational Interviewing  Research officer, political partyCognitive Behavioral Therapy  Crisis Intervention Model  SMART goals setting   Linda SquibbGreg Harshal [place], [place]
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
## 2017-11-17 NOTE — [facility] Group Notes (Signed)
[facility] Group Notes:  (Nursing/MHT/Case Management/Adjunct)  Date:  11/17/2017  Time:  3:15 PM  Type of Therapy:  Psychoeducational Skills  Participation Level:  Active  Participation Quality:  Sharing  Affect:  Appropriate  Cognitive:  Alert, Appropriate and Oriented  Insight:  Appropriate and Good  Engagement in Group:  Developing/Improving, Engaged and Resistant  Modes of Intervention:  Education, Socialization and Support  Summary of Progress/Problems:  Linda Tyler  Linda Tyler 11/17/2017, 3:15 PM

## 2017-11-17 NOTE — Progress Notes (Signed)
Patient was out in the day room this evening for brief interaction and socialization with peers, participate in games playing puzzle, received snacks for the night and took her medicines well, no complain of anxiety at this time. Denies any thoughts of SI/HI.    Patient is in bed resting with eyes closed respiration normal, 15 minute checks in progress for safety.

## 2017-11-17 NOTE — [facility] Group Notes (Signed)
[facility] LCSW Group Therapy Note  Date/Time: 11/17/17, 1300  Type of Therapy/Topic:  Group Therapy:  Feelings about Diagnosis  Participation Level:  Active   Mood:pleasant   Description of Group:    This group will allow patients to explore their thoughts and feelings about diagnoses they have received. Patients will be guided to explore their level of understanding and acceptance of these diagnoses. Facilitator will encourage patients to process their thoughts and feelings about the reactions of others to their diagnosis, and will guide patients in identifying ways to discuss their diagnosis with significant others in their lives. This group will be process-oriented, with patients participating in exploration of their own experiences as well as giving and receiving support and challenge from other group members.   Therapeutic Goals: 1. Patient will demonstrate understanding of diagnosis as evidence by identifying two or more symptoms of the disorder:  2. Patient will be able to express two feelings regarding the diagnosis 3. Patient will demonstrate ability to communicate their needs through discussion and/or role plays  Summary of Patient Progress: Pt shared with the group that she was dianosed with schizophrenia, PTSD, anxiety, depression, alcohol use disorder and OCD. Pt was quite active in the group discussion about accepting your own diagnosis and making decisions about treatment.  Good participation.        Therapeutic Modalities:   Cognitive Behavioral Therapy Brief Therapy Feelings Identification   Daleen SquibbGreg Benjamen Koelling, LCSW

## 2017-11-18 DIAGNOSIS — F429 Obsessive-compulsive disorder, unspecified: Secondary | ICD-10-CM | POA: Diagnosis present

## 2017-11-18 DIAGNOSIS — F4310 Post-traumatic stress disorder, unspecified: Secondary | ICD-10-CM | POA: Diagnosis present

## 2017-11-18 MED ORDER — TOPIRAMATE 100 MG PO TABS
100.0000 mg | ORAL_TABLET | Freq: Every day | ORAL | Status: DC
  Administered 2017-11-18 – 2017-11-22 (×5): 100 mg via ORAL
  Filled 2017-11-18 (×5): qty 1
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
## 2017-11-18 NOTE — [facility] Group Notes (Signed)
[facility] Group Notes:  (Nursing/MHT/Case Management/Adjunct)  Date:  11/18/2017  Time:  1:51 AM  Type of Therapy:  Psychoeducational Skills  Participation Level:  Active  Participation Quality:  Appropriate  Affect:  Appropriate  Cognitive:  Appropriate  Insight:  Appropriate and Good  Engagement in Group:  Engaged  Modes of Intervention:  Discussion, Socialization and Support  Summary of Progress/Problems:  Linda Tyler  Linda Tyler 11/18/2017, 1:51 AM

## 2017-11-18 NOTE — Progress Notes (Signed)
D: Patient denies SI/HI/AVH. Patient presents as apprehensive and paranoid during assessment. Patient has no physical complaints at this time. Patient is seen in milieu. Patient is guarded and apprehensive of this nurse. Patient is cooperative with treatment.   A: Patient was assessed by this nurse. Patient received scheduled medications. Q x 15 minute observation checks were completed for safety. Patient was provided with verbal education on provided medications. Patient care plan was reviewed. Patient was offered support and encouragement. Patient was encourage to attend groups, participate in unit activities and continue with plan of care.   R: Patient adheres with scheduled medication.  Patient has no complaints of pain at this time. Patient is receptive to treatment and safety maintained on unit. Patient had an estimated 6.5 hours of restful sleep.

## 2017-11-18 NOTE — Progress Notes (Signed)
Recreation Therapy Notes  Date: 12.05.2018  Time: 9:30 am  Location: Craft Room  Behavioral response: N/A  Intervention Topic: Communication  Discussion/Intervention: Patient did not attend group. Clinical Observations/Feedback:  Patient did not attend group. Ysabel Stankovich LRT/CTRS         Dantonio Justen 11/18/2017 12:38 PM

## 2017-11-18 NOTE — Progress Notes (Signed)
CH follow-up after drill. Patient was on phone. CH left materials on emotions and will follow-up later.   11/18/17 1200  Clinical Encounter Type  Visited With Patient  Visit Type Follow-up;Spiritual support;Behavioral Health  Referral From Patient;Chaplain  Spiritual Encounters  Spiritual Needs Literature;Brochure

## 2017-11-18 NOTE — Progress Notes (Signed)
D- Patient alert and oriented. Patient presents in a depressed/anxious mood stating that she slept well and "that sleep aid they gave me last night really worked". Patient states she is anxious because "I think the doctors and nurses think I'm some crazy loon". Patient states that her depression level is "3/10" because she thinks "nobody wants me here and I'm being ignored". Patient reports that she is seeing "yellow lace going across the wall" and "hearing that I'm a loser. Patient denies SI, HI, at this time.  A- Scheduled medications administered to patient, per MD orders. Support and encouragement provided.  Routine safety checks conducted every 15 minutes.  Patient informed to notify staff with problems or concerns.  R- No adverse drug reactions noted. Patient contracts for safety at this time. Patient compliant with medications and treatment plan. Patient receptive, calm, and cooperative. Patient interacts well with others on the unit.  Patient remains safe at this time.  '

## 2017-11-18 NOTE — [facility] Group Notes (Signed)
[facility] Group Notes:  (Nursing/MHT/Case Management/Adjunct)  Date:  11/18/2017  Time:  9:40 PM  Type of Therapy:  Group Therapy  Participation Level:  Did Not Attend Summary of Progress/Problems:  Mayra NeerJackie L Samariyah Cowles 11/18/2017, 9:40 PM

## 2017-11-18 NOTE — Progress Notes (Signed)
Recreation Therapy Notes  Date: 12.05.2018  Time: 3:00pm  Location: Craft room  Behavioral response: Appropriate  Group Type: Art/Craft  Participation level: Active  Communication: Patient was social with peers and staff.  Comments: N/A  Ethelwyn Gilbertson LRT/CTRS        Sheriann Newmann 11/18/2017 4:17 PM 

## 2017-11-18 NOTE — Plan of Care (Signed)
Patient has been observed participating in group and engaging with others on the unit. Patient states that she slept well last night with the help of an aide. Patient verbalizes understanding of the information given to her. Patient reports that she is anxious because she feels that "the doctors and nurses are looking at her like I'm some crazy loon". Patient also states that she is feeling depressed rating it as a "3/10" because she doesn't feel like anybody wants her here. Patient states that she is having visual hallucinations of "yellow lace going across the walls" and auditory hallucinations of "hearing that I'm a loser". Patient demonstrates self-control and has been in compliance with treatment/medication plan. Patient has remained free from injury during this visit and remains safe on the unit. Patient denies SI/HI at this time and is not experiencing any complications at this time. Adequate nutrition has been maintained and patient has been observed functioning at an adequate level.

## 2017-11-18 NOTE — Progress Notes (Signed)
[facility]  11/18/2017 10:23 AM Linda Tyler  MRN:  [id number]  Subjective:  Linda Tyler felt very tense yesterday as a result of flashbacks. She feels calmer today. She still hears voices and is slightly paranoid.Slept well. Complains of migraine headache. We had a long conversation about her diagnosis, treatment and coping skills. She denies symptoms of withdrawal but reports cravings.  Treatment plan. We will continue Zyprexa, Luvox and Neurontin. We will add Topamax for headache prevention and mood stabilization. She almost completed Librium taper.  Social/disposition. She will return with her husband. [facility]  Principal Problem: Schizoaffective disorder, bipolar type (HCC) Diagnosis:   Patient Active Problem List   Diagnosis Date Noted  . PTSD (post-traumatic stress disorder) [F43.10] 11/18/2017  . OCD (obsessive compulsive disorder) [F42.9] 11/18/2017  . Alcohol use disorder, moderate, dependence (HCC) [F10.20] 11/16/2017  . Suicidal ideation [R45.851] 11/16/2017  . Schizoaffective disorder, bipolar type (HCC) [F25.0] 11/15/2017  . S/P hysterectomy [Z90.710] 11/24/2016  . LV dysfunction [I51.9] 07/24/2014  . Hypokalemia [E87.6] 06/24/2012  . Skin rash [R21] 06/23/2012  . Allergic drug rash [L27.0] 06/23/2012  . Cellulitis [L03.90] 06/23/2012  . Tick bite [id number][W57.XXXA] 06/23/2012  . Syncope and collapse [R55] 06/23/2012  . Chest pain on breathing [R07.1] 06/23/2012  . Dehydration [E86.0] 06/23/2012   Total Time spent with patient: 20 minutes  Past Psychiatric History: bipolar disorder, alcoholism.  Past Medical History:  Past Medical History:  Diagnosis Date  . Abnormal uterine bleeding (AUB)   . Cellulitis   . Irritable bowel syndrome with constipation 10/2016  . LV dysfunction    EF 45-50% by 2-D echo in the past  . Skin rash     Past Surgical History:  Procedure Laterality Date  . dental extra  2016  . LAPAROSCOPIC VAGINAL  HYSTERECTOMY WITH SALPINGECTOMY N/A 11/24/2016   Procedure: LAPAROSCOPIC ASSISTED VAGINAL HYSTERECTOMY WITH BILATERAL SALPINGECTOMY, LEFT OOPHERECTOMY;  Surgeon: Mitchel HonourMegan Morris, DO;  Location: [facility];  Service: Gynecology;  Laterality: N/A;  . TRANSTHORACIC ECHOCARDIOGRAM  06/25/2012   EF 45% TO 50%. RV- CAVITY SIZE MODERATELY DECREASED.  Marland Kitchen. TUBAL LIGATION  2007   Family History:  Family History  Problem Relation Age of Onset  . Diabetes Mother   . Heart failure Mother   . Heart disease Mother        died in her 7850s with a massive MI  . Heart failure Father   . Hypertension Father    Family Psychiatric  History: mother and brother with schizophrenia. Social History:  Social History   Substance and Sexual Activity  Alcohol Use No     Social History   Substance and Sexual Activity  Drug Use No    Social History   Socioeconomic History  . Marital status: Married    Spouse name: None  . Number of children: None  . Years of education: None  . Highest education level: None  Social Needs  . Financial resource strain: None  . Food insecurity - worry: None  . Food insecurity - inability: None  . Transportation needs - medical: None  . Transportation needs - non-medical: None  Occupational History  . None  Tobacco Use  . Smoking status: Former Smoker    Packs/day: 0.00    Types: Cigarettes    Last attempt to quit: 11/19/2013    Years since quitting: 4.0  . Smokeless tobacco: Never Used  . Tobacco comment: rare cigar at present  Substance and Sexual Activity  .  Alcohol use: No  . Drug use: No  . Sexual activity: None  Other Topics Concern  . None  Social History Narrative  . None   Additional Social History:                         Sleep: Fair  Appetite:  Fair  Current Medications: Current Facility-Administered Medications  Medication Dose Route Frequency Provider Last Rate Last Dose  . acetaminophen (TYLENOL) tablet 650 mg  650 mg  Oral Q6H PRN Cindee LameIsbell, Lauren M, MD      . alum & mag hydroxide-simeth (MAALOX/MYLANTA) 200-200-20 MG/5ML suspension 30 mL  30 mL Oral Q4H PRN Cindee LameIsbell, Lauren M, MD      . chlordiazePOXIDE (LIBRIUM) capsule 25 mg  25 mg Oral QID Tylan Briguglio B, MD   25 mg at 11/18/17 0900  . chlorproMAZINE (THORAZINE) tablet 50 mg  50 mg Oral QID PRN Laporchia Nakajima B, MD      . feeding supplement (ENSURE ENLIVE) (ENSURE ENLIVE) liquid 237 mL  237 mL Oral BID BM Juanette Urizar B, MD   237 mL at 11/17/17 1520  . fluvoxaMINE (LUVOX) tablet 100 mg  100 mg Oral QHS Davaun Quintela B, MD   100 mg at 11/17/17 2133  . gabapentin (NEURONTIN) capsule 300 mg  300 mg Oral TID Cindee LameIsbell, Lauren M, MD   300 mg at 11/18/17 0900  . ibuprofen (ADVIL,MOTRIN) tablet 800 mg  800 mg Oral Q6H PRN Cindee LameIsbell, Lauren M, MD      . linaclotide Karlene Einstein(LINZESS) capsule 290 mcg  290 mcg Oral QAC breakfast Cindee LameIsbell, Lauren M, MD   290 mcg at 11/18/17 0900  . magnesium hydroxide (MILK OF MAGNESIA) suspension 30 mL  30 mL Oral Daily PRN Cindee LameIsbell, Lauren M, MD      . multivitamin with minerals tablet 1 tablet  1 tablet Oral Daily Ayari Liwanag B, MD   1 tablet at 11/18/17 0900  . nicotine (NICODERM CQ - dosed in mg/24 hours) patch 21 mg  21 mg Transdermal Daily Betha Shadix B, MD   21 mg at 11/18/17 0900  . OLANZapine (ZYPREXA) tablet 10 mg  10 mg Oral QHS Saurav Crumble B, MD   10 mg at 11/17/17 2133  . SUMAtriptan (IMITREX) tablet 50 mg  50 mg Oral Q2H PRN Shaunie Boehm B, MD   50 mg at 11/18/17 0958  . temazepam (RESTORIL) capsule 15 mg  15 mg Oral QHS PRN Stormi Vandevelde B, MD      . topiramate (TOPAMAX) tablet 100 mg  100 mg Oral QHS Akaila Rambo B, MD        Lab Results: No results found for this or any previous visit (from the past 48 hour(s)).  Blood Alcohol level:  Lab Results  Component Value Date   ETH 135 (H) 11/15/2017    Metabolic Disorder Labs: Lab Results  Component Value Date   HGBA1C  5.1 11/16/2017   MPG 100 11/16/2017   No results found for: PROLACTIN Lab Results  Component Value Date   CHOL 175 11/16/2017   TRIG 112 11/16/2017   HDL 66 11/16/2017   CHOLHDL 2.7 11/16/2017   VLDL 22 11/16/2017   LDLCALC 87 11/16/2017    Physical Findings: AIMS: Facial and Oral Movements Muscles of Facial Expression: None, normal Lips and Perioral Area: None, normal Jaw: None, normal Tongue: None, normal,Extremity Movements Upper (arms, wrists, hands, fingers): None, normal Lower (legs, knees, ankles, toes): None, normal, Trunk  Movements Neck, shoulders, hips: None, normal, Overall Severity Severity of abnormal movements (highest score from questions above): None, normal Incapacitation due to abnormal movements: None, normal Patient's awareness of abnormal movements (rate only patient's report): No Awareness, Dental Status Current problems with teeth and/or dentures?: No Does patient usually wear dentures?: No  CIWA:  CIWA-Ar Total: 4 COWS:     Musculoskeletal: Strength & Muscle Tone: within normal limits Gait & Station: normal Patient leans: N/A  Psychiatric Specialty Exam: Physical Exam  Nursing note and vitals reviewed. Psychiatric: Her speech is normal. Her mood appears anxious. She is actively hallucinating. Thought content is paranoid and delusional. Cognition and memory are normal. She expresses impulsivity.    Review of Systems  Neurological: Positive for headaches.  Psychiatric/Behavioral: Positive for hallucinations and substance abuse.  All other systems reviewed and are negative.   Blood pressure 113/77, pulse 90, temperature 97.9 F (36.6 C), temperature source Oral, resp. rate 16, height 5\' 6"  (1.676 m), weight 60.3 kg (133 lb), last menstrual period 11/13/2016, SpO2 100 %.Body mass index is 21.47 kg/m.  General Appearance: Casual  Eye Contact:  Good  Speech:  Clear and Coherent  Volume:  Normal  Mood:  Anxious  Affect:  Appropriate  Thought  Process:  Goal Directed and Descriptions of Associations: Intact  Orientation:  Full (Time, Place, and Person)  Thought Content:  Delusions, Hallucinations: Auditory Visual and Paranoid Ideation  Suicidal Thoughts:  No  Homicidal Thoughts:  No  Memory:  Immediate;   Fair Recent;   Fair Remote;   Fair  Judgement:  Poor  Insight:  Present  Psychomotor Activity:  Normal  Concentration:  Concentration: Fair and Attention Span: Fair  Recall:  [employer] of Knowledge:  Fair  Language:  Fair  Akathisia:  No  Handed:  Right  AIMS (if indicated):     Assets:  Communication Skills Desire for Improvement Housing Physical Health Resilience Social Support  ADL's:  Intact  Cognition:  WNL  Sleep:  Number of Hours: 6.5     Treatment Plan Summary: Daily contact with patient to assess and evaluate symptoms and progress in treatment and Medication management   Ms. Allsbrook is a 36 year old female with a history of untreated psychosis for about one year admitted for worsening of her symptoms.   #Psychosis -continue Zyprexa 10 mg nightly  -head CT scan, negative -start Topamax 100 mg nightly  #Depression/anxiety -continue Luvox 100 mg nightly  #Insomnia -continue Restoril 15 mg nightly PRN  #Poor oral intake due to paranoia -Ensure TID -paranoid tray  #Metabolic syndrome monitoring -Lipid panel, TSH and HgbA1C are normal -EKG, pending -pregnancy test is negative  #Alcohol abuse -continue Librium taper -Neurontin 300 gm TID  #Constipation -Linzess daily -Mag citrate once  #Headaches -CT scan, negative -Topamax 10 mg nightly -Imitrex PRN  #Smoking cessation -nicotine patch is available  #Disposition -Discharge to home with the husband -follow up with Loletta Parish, MD 11/18/2017, 10:23 AM

## 2017-11-19 LAB — CHLAMYDIA/NGC RT PCR (ARMC ONLY)
Chlamydia Tr: NOT DETECTED
N gonorrhoeae: NOT DETECTED

## 2017-11-19 LAB — HEPATITIS C ANTIBODY: HCV Ab: <0.1 s/co ratio (ref 0.0–0.9)

## 2017-11-19 MED ORDER — DIVALPROEX SODIUM 500 MG PO DR TAB
500.0000 mg | DELAYED_RELEASE_TABLET | Freq: Three times a day (TID) | ORAL | Status: DC
  Administered 2017-11-19 – 2017-11-23 (×12): 500 mg via ORAL
  Filled 2017-11-19 (×12): qty 1

## 2017-11-19 MED ORDER — OLANZAPINE 5 MG PO TABS
15.0000 mg | ORAL_TABLET | Freq: Every day | ORAL | Status: DC
  Administered 2017-11-19 – 2017-11-22 (×4): 15 mg via ORAL
  Filled 2017-11-19 (×4): qty 1

## 2017-11-19 NOTE — [facility] Group Notes (Signed)
[facility] Group Notes:  (Nursing/MHT/Case Management/Adjunct)  Date:  11/19/2017  Time:  2:53 PM  Type of Therapy:  Psychoeducational Skills  Participation Level:  Active  Participation Quality:  Appropriate, Attentive, Sharing and Supportive  Affect:  Appropriate  Cognitive:  Alert, Appropriate and Oriented  Insight:  Appropriate  Engagement in Group:  Engaged, Improving and Supportive  Modes of Intervention:  Discussion and Education  Summary of Progress/Problems:  Linda Tyler 11/19/2017, 2:53 PM

## 2017-11-19 NOTE — Plan of Care (Signed)
Stated that the medication is helping, "I feel great, ready to go home and take care of my family and make them proud"
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
## 2017-11-19 NOTE — Plan of Care (Signed)
Patient is actively participating in groups and maintained safety in the unit.

## 2017-11-19 NOTE — [facility] Group Notes (Signed)
LCSW Group Therapy Note  11/19/2017 1:00pm  Type of Therapy/Topic:  Group Therapy:  Balance in Life  Participation Level:  Active  Description of Group:    This group will address the concept of balance and how it feels and looks when one is unbalanced. Patients will be encouraged to process areas in their lives that are out of balance and identify reasons for remaining unbalanced. Facilitators will guide patients in utilizing problem-solving interventions to address and correct the stressor making their life unbalanced. Understanding and applying boundaries will be explored and addressed for obtaining and maintaining a balanced life. Patients will be encouraged to explore ways to assertively make their unbalanced needs known to significant others in their lives, using other group members and facilitator for support and feedback.  Therapeutic Goals: 1. Patient will identify two or more emotions or situations they have that consume much of in their lives. 2. Patient will identify signs/triggers that life has become out of balance:  3. Patient will identify two ways to set boundaries in order to achieve balance in their lives:  4. Patient will demonstrate ability to communicate their needs through discussion and/or role plays  Summary of Patient Progress:  Linda Tyler was very active in process group. She was able to identify several emotions that cause her imbalance to include anger and worthlessness.  Linda Tyler shared that being anxious and depressed also caused her imbalance.  Linda Tyler shared that having the support from her husband helps her to regain her balance.  Linda Tyler was able to offer positive advice and suggestions to others in the group.    Therapeutic Modalities:   Cognitive Behavioral Therapy Solution-Focused Therapy Assertiveness Training  Alease FrameSonya S Ilia Dimaano, [place] 11/19/2017 3:11 PM

## 2017-11-19 NOTE — Progress Notes (Signed)
Patient has been in the milieu, pleasant and cooperative. Alert and oriented and denying thoughts of self harm. Patient attended group and had a snack. Presented to the medication room and stated "that medication, whatever I am taking, is helping me, you guys have done a great job, I feel much better and now  I will go home and be able to cook for my husband..." patient took her bedtime medications and currently resting in bed. No sign of discomfort. Support and encouragements provided. Safety precautions maintained per q15 mn observations.

## 2017-11-19 NOTE — Progress Notes (Signed)
D:Pt this evening denies SI/HI and verbally contracts for safety. Pt this evening when asked about auditory and visual hallucinations reports, "I'm not hearing voices anymore which I think is from the meds the doctor gave me, but I still am seeing things". When asked to describe the visual hallucinations the pt states, "when I'm sitting in my bed it's like I see a water drop shimmering on the floor and streamers against the walls". Pt upon interaction can be guarded at time, but is generally appropriate and engaging. Only pt complaints are about hallucinations this evening.   A: Q x 15 minute observation checks were completed for safety. Patient was provided with education. Patient was given scheduled medications. Patient  was encourage to attend groups, participate in unit activities and continue with plan of care.   R:Patient is complaint with medication and unit procedures. Pt frequently seen in the milieu socializing with peers and participating. Pt today reports feeling, "better" today then she did yesterday. 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            Patient slept for Estimated Hours of 6.15; Precautionary checks every 15 minutes for safety maintained, room free of safety hazards, patient sustains no injury or falls during this shift.

## 2017-11-19 NOTE — Progress Notes (Signed)
Recreation Therapy Notes   Date: 12.06.2018  Time: 3:00pm  Location: Craft room  Behavioral response: Appropriate  Group Type: Art/Craft  Participation level: Active  Communication: Patient was social with peers and staff.  Comments: N/A  Mayerly Kaman LRT/CTRS        Renisha Cockrum 11/19/2017 4:33 PM

## 2017-11-19 NOTE — [facility] Group Notes (Signed)
  11/19/2017 9am  Type of Therapy and Topic: Group Therapy: Goals Group: SMART Goals   Participation Level: Active  Description of Group:    The purpose of a daily goals group is to assist and guide patients in setting recovery/wellness-related goals. The objective is to set goals as they relate to the crisis in which they were admitted. Patients will be using SMART goal modalities to set measurable goals. Characteristics of realistic goals will be discussed and patients will be assisted in setting and processing how one will reach their goal. Facilitator will also assist patients in applying interventions and coping skills learned in psycho-education groups to the SMART goal and process how one will achieve defined goal.   Therapeutic Goals:   -Patients will develop and document one goal related to or their crisis in which brought them into treatment.  -Patients will be guided by LCSW using SMART goal setting modality in how to set a measurable, attainable, realistic and time sensitive goal.  -Patients will process barriers in reaching goal.  -Patients will process interventions in how to overcome and successful in reaching goal.   Patient's Goal: "To try to trust staff members"  Therapeutic Modalities:  Motivational Interviewing  Cognitive Behavioral Therapy  Crisis Intervention Model  SMART goals setting  Johny ShearsCassandra  Dequan Kindred, LCSW 11/19/2017 10:36 AM

## 2017-11-19 NOTE — [facility] Group Notes (Signed)
[facility] Group Notes:  (Nursing/MHT/Case Management/Adjunct)  Date:  11/19/2017  Time:  11:17 PM  Type of Therapy:  Evening Wrap-up Group  Participation Level:  Active  Participation Quality:  Appropriate and Attentive  Affect:  Appropriate  Cognitive:  Alert and Appropriate  Insight:  Appropriate, Good and Improving  Engagement in Group:  Developing/Improving and Engaged  Modes of Intervention:  Discussion, Socialization and Support  Summary of Progress/Problems:  Linda MorrowChelsea Tyler Linda Tyler 11/19/2017, 11:17 PM

## 2017-11-19 NOTE — Plan of Care (Signed)
Pt. Able to remain safe on the unit. Pt verbally contracts for safety. Pt shows improved sleep by sleep reports. Pt shows more engagement in unit activities and participates in groups. Pt more visibly active in the milieu. Pt reports eating, "good".

## 2017-11-19 NOTE — Progress Notes (Signed)
Recreation Therapy Notes  Date: 12.06.2018  Time: 9:30 am  Location: Craft Room  Behavioral response: Appropriate  Intervention Topic: Teamwork  Discussion/Intervention: Group content on today was focused on teamwork. The group identified what teamwork is. Individuals described who is a part of their team. Patients expressed why they thought teamwork is important. The group stated reasons why they thought it was easier to work with a Comptrollersmaller/larger team. Individuals discussed some positives and negatives of working with a team. Patients gave examples of past experiences they had while working with a team. The group participated in the intervention "Save the [place]", patients were in groups and had to keep the balls on the surface given.  Clinical Observations/Feedback:  Patient came to group and stated teamwork is when people come together to accomplish a goal. She explained that when working in a team it is important to listen to everyone. Individual identified that she prefers to work in s smaller team. Patient participated in the intervention and was social with her peers and staff during group.  Lia Vigilante LRT/CTRS         Deniss Wormley 11/19/2017 10:40 AM

## 2017-11-19 NOTE — Progress Notes (Signed)
Patient was anxious and depressed this morning.Verbalized that she is not getting any better here.Later patient states "I am sorry for my behavior this morning."Denies SI,HI and AVH.Patient states "I am happy now.The groups helping me."Compliant with medications.Appropriate with staff & peers.Appetite and energy level good.Support and encouragement given.

## 2017-11-20 DIAGNOSIS — F250 Schizoaffective disorder, bipolar type: Secondary | ICD-10-CM

## 2017-11-20 NOTE — [facility] Group Notes (Signed)
[facility] LCSW Group Therapy Note  Date/Time: 11/20/17, 1400  Type of Therapy and Topic:  Group Therapy:  Feelings around Relapse and Recovery  Participation Level:  Active   Mood:pleasant  Description of Group:    Patients in this group will discuss emotions they experience before and after a relapse. They will process how experiencing these feelings, or avoidance of experiencing them, relates to having a relapse. Facilitator will guide patients to explore emotions they have related to recovery. Patients will be encouraged to process which emotions are more powerful. They will be guided to discuss the emotional reaction significant others in their lives may have to patients' relapse or recovery. Patients will be assisted in exploring ways to respond to the emotions of others without this contributing to a relapse.  Therapeutic Goals: 1. Patient will identify two or more emotions that lead to relapse for them:  2. Patient will identify two emotions that result when they relapse:  3. Patient will identify two emotions related to recovery:  4. Patient will demonstrate ability to communicate their needs through discussion and/or role plays.   Summary of Patient Progress: Pt identified shame, worthless, "abomination" as feelings that lead to relapse for her.  Pt was very active in group discussion about addiction and how emotions can lead to relapse.  Pt shared that she had been sober for a year several years ago and is working to get back to sobriety now.     Therapeutic Modalities:   Cognitive Behavioral Therapy Solution-Focused Therapy Assertiveness Training Relapse Prevention Therapy  Daleen SquibbGreg Kaylem Gidney, LCSW

## 2017-11-20 NOTE — Tx Team (Signed)
Interdisciplinary Treatment and Diagnostic Plan Update  11/20/2017 Time of Session: 11:09 AM  Linda Tyler MRN: [id number]  Principal Diagnosis: Schizoaffective disorder, bipolar type (HCC)  Secondary Diagnoses: Principal Problem:   Schizoaffective disorder, bipolar type (HCC) Active Problems:   Alcohol use disorder, moderate, dependence (HCC)   Suicidal ideation   PTSD (post-traumatic stress disorder)   OCD (obsessive compulsive disorder)   Current Medications:  Current Facility-Administered Medications  Medication Dose Route Frequency Provider Last Rate Last Dose  . acetaminophen (TYLENOL) tablet 650 mg  650 mg Oral Q6H PRN Cindee LameIsbell, Lauren M, MD      . alum & mag hydroxide-simeth (MAALOX/MYLANTA) 200-200-20 MG/5ML suspension 30 mL  30 mL Oral Q4H PRN Cindee LameIsbell, Lauren M, MD      . chlorproMAZINE (THORAZINE) tablet 50 mg  50 mg Oral QID PRN Pucilowska, Jolanta B, MD      . divalproex (DEPAKOTE) DR tablet 500 mg  500 mg Oral Q8H Pucilowska, Jolanta B, MD   500 mg at 11/20/17 0649  . feeding supplement (ENSURE ENLIVE) (ENSURE ENLIVE) liquid 237 mL  237 mL Oral BID BM Pucilowska, Jolanta B, MD   237 mL at 11/20/17 1007  . fluvoxaMINE (LUVOX) tablet 100 mg  100 mg Oral QHS Pucilowska, Jolanta B, MD   100 mg at 11/19/17 2145  . gabapentin (NEURONTIN) capsule 300 mg  300 mg Oral TID Cindee LameIsbell, Lauren M, MD   300 mg at 11/20/17 0802  . ibuprofen (ADVIL,MOTRIN) tablet 800 mg  800 mg Oral Q6H PRN Cindee LameIsbell, Lauren M, MD      . linaclotide Karlene Einstein(LINZESS) capsule 290 mcg  290 mcg Oral QAC breakfast Cindee LameIsbell, Lauren M, MD   290 mcg at 11/20/17 0802  . magnesium hydroxide (MILK OF MAGNESIA) suspension 30 mL  30 mL Oral Daily PRN Cindee LameIsbell, Lauren M, MD      . multivitamin with minerals tablet 1 tablet  1 tablet Oral Daily Pucilowska, Jolanta B, MD   1 tablet at 11/20/17 0802  . nicotine (NICODERM CQ - dosed in mg/24 hours) patch 21 mg  21 mg Transdermal Daily Pucilowska, Jolanta B, MD   21 mg at 11/20/17 0803   . OLANZapine (ZYPREXA) tablet 15 mg  15 mg Oral QHS Pucilowska, Jolanta B, MD   15 mg at 11/19/17 2145  . SUMAtriptan (IMITREX) tablet 50 mg  50 mg Oral Q2H PRN Pucilowska, Jolanta B, MD   50 mg at 11/18/17 0958  . temazepam (RESTORIL) capsule 15 mg  15 mg Oral QHS PRN Pucilowska, Jolanta B, MD      . topiramate (TOPAMAX) tablet 100 mg  100 mg Oral QHS Pucilowska, Jolanta B, MD   100 mg at 11/19/17 2145    PTA Medications: Medications Prior to Admission  Medication Sig Dispense Refill Last Dose  . b complex vitamins capsule Take 1 capsule by mouth daily.   Past Week at Unknown time  . buPROPion (WELLBUTRIN XL) 150 MG 24 hr tablet Take 150 mg by mouth 2 (two) times daily.   Past Week at Unknown time  . gabapentin (NEURONTIN) 300 MG capsule Take 300 mg by mouth 3 (three) times daily.   Past Week at Unknown time  . ibuprofen (ADVIL,MOTRIN) 800 MG tablet Take 1 tablet (800 mg total) by mouth every 6 (six) hours as needed. 30 tablet 1   . linaclotide (LINZESS) 290 MCG CAPS capsule Take 290 mcg by mouth daily before breakfast.   11/23/2016 at Unknown time  . Multiple Vitamin (MULTIVITAMIN) tablet  Take 1 tablet by mouth daily.   Past Week at Unknown time  . Multiple Vitamins-Minerals (VITA HAIR) TABS Take by mouth daily. Skin,hair,nail vitamin   Past Week at Unknown time  . oxyCODONE-acetaminophen (PERCOCET/ROXICET) 5-325 MG tablet Take 1-2 tablets by mouth every 4 (four) hours as needed (moderate to severe pain (when tolerating fluids)). 30 tablet 0     Patient Stressors: Health problems Medication change or noncompliance Substance abuse Traumatic event Other: hallucinations  Patient Strengths: Ability for insight Active sense of humor Communication skills General fund of knowledge Motivation for treatment/growth Supportive family/friends  Treatment Modalities: Medication Management, Group therapy, Case management,  1 to 1 session with clinician, Psychoeducation, Recreational  therapy.   Physician Treatment Plan for Primary Diagnosis: Schizoaffective disorder, bipolar type (HCC) Long Term Goal(s): Improvement in symptoms so as ready for discharge  Short Term Goals: Ability to identify changes in lifestyle to reduce recurrence of condition will improve Ability to verbalize feelings will improve Ability to disclose and discuss suicidal ideas Ability to demonstrate self-control will improve Ability to identify and develop effective coping behaviors will improve Ability to maintain clinical measurements within normal limits will improve Compliance with prescribed medications will improve Ability to identify triggers associated with substance abuse/mental health issues will improve Ability to identify changes in lifestyle to reduce recurrence of condition will improve Ability to demonstrate self-control will improve Ability to identify triggers associated with substance abuse/mental health issues will improve  Medication Management: Evaluate patient's response, side effects, and tolerance of medication regimen.  Therapeutic Interventions: 1 to 1 sessions, Unit Group sessions and Medication administration.  Evaluation of Outcomes: Progressing  Physician Treatment Plan for Secondary Diagnosis: Principal Problem:   Schizoaffective disorder, bipolar type (HCC) Active Problems:   Alcohol use disorder, moderate, dependence (HCC)   Suicidal ideation   PTSD (post-traumatic stress disorder)   OCD (obsessive compulsive disorder)   Long Term Goal(s): Improvement in symptoms so as ready for discharge  Short Term Goals: Ability to identify changes in lifestyle to reduce recurrence of condition will improve Ability to verbalize feelings will improve Ability to disclose and discuss suicidal ideas Ability to demonstrate self-control will improve Ability to identify and develop effective coping behaviors will improve Ability to maintain clinical measurements within  normal limits will improve Compliance with prescribed medications will improve Ability to identify triggers associated with substance abuse/mental health issues will improve Ability to identify changes in lifestyle to reduce recurrence of condition will improve Ability to demonstrate self-control will improve Ability to identify triggers associated with substance abuse/mental health issues will improve  Medication Management: Evaluate patient's response, side effects, and tolerance of medication regimen.  Therapeutic Interventions: 1 to 1 sessions, Unit Group sessions and Medication administration.  Evaluation of Outcomes: Progressing   RN Treatment Plan for Primary Diagnosis: Schizoaffective disorder, bipolar type (HCC) Long Term Goal(s): Knowledge of disease and therapeutic regimen to maintain health will improve  Short Term Goals: Ability to verbalize frustration and anger appropriately will improve, Ability to demonstrate self-control, Ability to participate in decision making will improve and Ability to identify and develop effective coping behaviors will improve  Medication Management: RN will administer medications as ordered by provider, will assess and evaluate patient's response and provide education to patient for prescribed medication. RN will report any adverse and/or side effects to prescribing provider.  Therapeutic Interventions: 1 on 1 counseling sessions, Psychoeducation, Medication administration, Evaluate responses to treatment, Monitor vital signs and CBGs as ordered, Perform/monitor CIWA, COWS, AIMS and Fall Risk  screenings as ordered, Perform wound care treatments as ordered.  Evaluation of Outcomes: Progressing   LCSW Treatment Plan for Primary Diagnosis: Schizoaffective disorder, bipolar type (HCC) Long Term Goal(s): Safe transition to appropriate next level of care at discharge, Engage patient in therapeutic group addressing interpersonal concerns.  Short Term  Goals: Engage patient in aftercare planning with referrals and resources, Facilitate acceptance of mental health diagnosis and concerns, Identify triggers associated with mental health/substance abuse issues and Increase skills for wellness and recovery  Therapeutic Interventions: Assess for all discharge needs, 1 to 1 time with Social worker, Explore available resources and support systems, Assess for adequacy in community support network, Educate family and significant other(s) on suicide prevention, Complete Psychosocial Assessment, Interpersonal group therapy.  Evaluation of Outcomes: Progressing   Progress in Treatment: Attending groups: Yes Participating in groups: Yes Taking medication as prescribed: Yes Toleration medication: Yes, no side effects reported at this time Family/Significant other contact made: Yes with husband  Patient understands diagnosis: Yes AEB patient having an understanding of continuing services needed once discharged.  Discussing patient identified problems/goals with staff: Yes Medical problems stabilized or resolved: Yes Denies suicidal/homicidal ideation: Yes Issues/concerns per patient self-inventory: None Other: N/A  New problem(s) identified: None identified at this time.   New Short Term/Long Term Goal(s): None identified at this time.   Discharge Plan or Barriers: Return home to husband and attend services for medications management.  Reason for Continuation of Hospitalization: Anxiety Depression Medication stabilization   Estimated Length of Stay: 3-5 days   Attendees: Patient:Linda Tyler 11/20/2017  11:09 AM Physician: Corinna Gab, MD        11/20/2017  11:09 AM Nursing: Ace Gins, RN            11/20/2017  11:09 AM RN Care Manager:     11/20/2017  11:09 AM Social Worker: Johny Shears LCSWA           11/20/2017  11:09 AM Recreational Therapist: Danella Deis. Dreama Saa, LRT        11/20/2017  11:09 AM Other:      11/20/2017  11:09  AM Other:   11/20/2017  11:09 AM   Scribe for Treatment Team: Johny Shears LCSWA 11/20/2017 11:09 AM

## 2017-11-20 NOTE — Progress Notes (Signed)
Recreation Therapy Notes          Linda Tyler 11/20/2017 2:10 PM

## 2017-11-20 NOTE — Progress Notes (Signed)
[facility] Note  11/20/2017 12:28 PM Linda Tyler  MRN:  [id number] Subjective:  Pt states that she is doing well today. She states that she feels very calm. She states, "The noises and all the paranoia are completely gone." She feels that the medications have been helpful and she has  Finally found medications that are effective for her. She states that the "sleep aid is very helpful for nightmares." She states that they are completely gone. She also states that her appetite is back and she is eating better. She has good insight into plan for discharge. She states that her husband is a great support for her. He has been looking into therapy for her which she is really looking forward to. She states that he has removed all guns and alcohol from the house. She is wanting to get back into arts and crafts and her and her husband decided that she will stay home and not work. She plans to stay busy by doing crafts. She also wants to attend AA meetings. She denies SI or thoughts of self harm today. She happily showed me a picture that she colored and is happy that she is doing artwork again because she had stopped for quite some time.   Principal Problem: Schizoaffective disorder, bipolar type (HCC) Diagnosis:   Patient Active Problem List   Diagnosis Date Noted  . PTSD (post-traumatic stress disorder) [F43.10] 11/18/2017  . OCD (obsessive compulsive disorder) [F42.9] 11/18/2017  . Alcohol use disorder, moderate, dependence (HCC) [F10.20] 11/16/2017  . Suicidal ideation [R45.851] 11/16/2017  . Schizoaffective disorder, bipolar type (HCC) [F25.0] 11/15/2017  . S/P hysterectomy [Z90.710] 11/24/2016  . LV dysfunction [I51.9] 07/24/2014  . Hypokalemia [E87.6] 06/24/2012  . Skin rash [R21] 06/23/2012  . Allergic drug rash [L27.0] 06/23/2012  . Cellulitis [L03.90] 06/23/2012  . Tick bite [id number][W57.XXXA] 06/23/2012  . Syncope and collapse [R55] 06/23/2012  . Chest pain on breathing [R07.1] 06/23/2012   . Dehydration [E86.0] 06/23/2012   Total Time spent with patient: 20 minutes  Past Psychiatric History: See H&P  Past Medical History:  Past Medical History:  Diagnosis Date  . Abnormal uterine bleeding (AUB)   . Cellulitis   . Irritable bowel syndrome with constipation 10/2016  . LV dysfunction    EF 45-50% by 2-D echo in the past  . Skin rash     Past Surgical History:  Procedure Laterality Date  . dental extra  2016  . LAPAROSCOPIC VAGINAL HYSTERECTOMY WITH SALPINGECTOMY N/A 11/24/2016   Procedure: LAPAROSCOPIC ASSISTED VAGINAL HYSTERECTOMY WITH BILATERAL SALPINGECTOMY, LEFT OOPHERECTOMY;  Surgeon: Mitchel HonourMegan Morris, DO;  Location: [facility];  Service: Gynecology;  Laterality: N/A;  . TRANSTHORACIC ECHOCARDIOGRAM  06/25/2012   EF 45% TO 50%. RV- CAVITY SIZE MODERATELY DECREASED.  Marland Kitchen. TUBAL LIGATION  2007   Family History:  Family History  Problem Relation Age of Onset  . Diabetes Mother   . Heart failure Mother   . Heart disease Mother        died in her 4950s with a massive MI  . Heart failure Father   . Hypertension Father    Family Psychiatric  History: See H&P Social History:  Social History   Substance and Sexual Activity  Alcohol Use No     Social History   Substance and Sexual Activity  Drug Use No    Social History   Socioeconomic History  . Marital status: Married    Spouse name: None  . Number of children:  None  . Years of education: None  . Highest education level: None  Social Needs  . Financial resource strain: None  . Food insecurity - worry: None  . Food insecurity - inability: None  . Transportation needs - medical: None  . Transportation needs - non-medical: None  Occupational History  . None  Tobacco Use  . Smoking status: Former Smoker    Packs/day: 0.00    Types: Cigarettes    Last attempt to quit: 11/19/2013    Years since quitting: 4.0  . Smokeless tobacco: Never Used  . Tobacco comment: rare cigar at present   Substance and Sexual Activity  . Alcohol use: No  . Drug use: No  . Sexual activity: None  Other Topics Concern  . None  Social History Narrative  . None   Additional Social History:                         Sleep: Good  Appetite:  Good  Current Medications: Current Facility-Administered Medications  Medication Dose Route Frequency Provider Last Rate Last Dose  . acetaminophen (TYLENOL) tablet 650 mg  650 mg Oral Q6H PRN Cindee LameIsbell, Lauren M, MD      . alum & mag hydroxide-simeth (MAALOX/MYLANTA) 200-200-20 MG/5ML suspension 30 mL  30 mL Oral Q4H PRN Cindee LameIsbell, Lauren M, MD      . chlorproMAZINE (THORAZINE) tablet 50 mg  50 mg Oral QID PRN Pucilowska, Jolanta B, MD      . divalproex (DEPAKOTE) DR tablet 500 mg  500 mg Oral Q8H Pucilowska, Jolanta B, MD   500 mg at 11/20/17 0649  . feeding supplement (ENSURE ENLIVE) (ENSURE ENLIVE) liquid 237 mL  237 mL Oral BID BM Pucilowska, Jolanta B, MD   237 mL at 11/20/17 1007  . fluvoxaMINE (LUVOX) tablet 100 mg  100 mg Oral QHS Pucilowska, Jolanta B, MD   100 mg at 11/19/17 2145  . gabapentin (NEURONTIN) capsule 300 mg  300 mg Oral TID Cindee LameIsbell, Lauren M, MD   300 mg at 11/20/17 0802  . ibuprofen (ADVIL,MOTRIN) tablet 800 mg  800 mg Oral Q6H PRN Cindee LameIsbell, Lauren M, MD      . linaclotide Karlene Einstein(LINZESS) capsule 290 mcg  290 mcg Oral QAC breakfast Cindee LameIsbell, Lauren M, MD   290 mcg at 11/20/17 0802  . magnesium hydroxide (MILK OF MAGNESIA) suspension 30 mL  30 mL Oral Daily PRN Cindee LameIsbell, Lauren M, MD      . multivitamin with minerals tablet 1 tablet  1 tablet Oral Daily Pucilowska, Jolanta B, MD   1 tablet at 11/20/17 0802  . nicotine (NICODERM CQ - dosed in mg/24 hours) patch 21 mg  21 mg Transdermal Daily Pucilowska, Jolanta B, MD   21 mg at 11/20/17 0803  . OLANZapine (ZYPREXA) tablet 15 mg  15 mg Oral QHS Pucilowska, Jolanta B, MD   15 mg at 11/19/17 2145  . SUMAtriptan (IMITREX) tablet 50 mg  50 mg Oral Q2H PRN Pucilowska, Jolanta B, MD   50 mg at  11/18/17 0958  . temazepam (RESTORIL) capsule 15 mg  15 mg Oral QHS PRN Pucilowska, Jolanta B, MD      . topiramate (TOPAMAX) tablet 100 mg  100 mg Oral QHS Pucilowska, Jolanta B, MD   100 mg at 11/19/17 2145    Lab Results:  Results for orders placed or performed during the hospital encounter of 11/15/17 (from the past 48 hour(s))  Hepatitis C antibody  Status: None   Collection Time: 11/18/17  5:17 PM  Result Value Ref Range   HCV Ab <0.1 0.0 - 0.9 s/co ratio    Comment: (NOTE)                                  Negative:     < 0.8                             Indeterminate: 0.8 - 0.9                                  Positive:     > 0.9 The CDC recommends that a positive HCV antibody result be followed up with a HCV Nucleic Acid Amplification test (161096). Performed At: [facility] [address] [place], [place] [id number] Jolene Schimke MD BJ:[id number]   Chlamydia/NGC rt PCR [facility] only)     Status: None   Collection Time: 11/18/17 11:00 PM  Result Value Ref Range   Specimen source GC/Chlam URINE, RANDOM    Chlamydia Tr NOT DETECTED NOT DETECTED   N gonorrhoeae NOT DETECTED NOT DETECTED    Comment: (NOTE) 100  This methodology has not been evaluated in pregnant women or in 200  patients with a history of hysterectomy. 300 400  This methodology will not be performed on patients less than 82  years of age.     Blood Alcohol level:  Lab Results  Component Value Date   ETH 135 (H) 11/15/2017    Metabolic Disorder Labs: Lab Results  Component Value Date   HGBA1C 5.1 11/16/2017   MPG 100 11/16/2017   No results found for: PROLACTIN Lab Results  Component Value Date   CHOL 175 11/16/2017   TRIG 112 11/16/2017   HDL 66 11/16/2017   CHOLHDL 2.7 11/16/2017   VLDL 22 11/16/2017   LDLCALC 87 11/16/2017    Physical Findings: AIMS: Facial and Oral Movements Muscles of Facial Expression: None, normal Lips and Perioral Area: None, normal Jaw: None,  normal Tongue: None, normal,Extremity Movements Upper (arms, wrists, hands, fingers): None, normal Lower (legs, knees, ankles, toes): None, normal, Trunk Movements Neck, shoulders, hips: None, normal, Overall Severity Severity of abnormal movements (highest score from questions above): None, normal Incapacitation due to abnormal movements: None, normal Patient's awareness of abnormal movements (rate only patient's report): No Awareness, Dental Status Current problems with teeth and/or dentures?: No Does patient usually wear dentures?: No  CIWA:  CIWA-Ar Total: 4 COWS:     Musculoskeletal: Strength & Muscle Tone: within normal limits Gait & Station: normal Patient leans: N/A  Psychiatric Specialty Exam: Physical Exam  ROS  Blood pressure 111/78, pulse 96, temperature 97.8 F (36.6 C), temperature source Oral, resp. rate 16, height 5\' 6"  (1.676 m), weight 60.3 kg (133 lb), last menstrual period 11/13/2016, SpO2 100 %.Body mass index is 21.47 kg/m.  General Appearance: Casual  Eye Contact:  Good  Speech:  Clear and Coherent  Volume:  Normal  Mood:  Euthymic  Affect:  Constricted  Thought Process:  Goal Directed  Orientation:  Full (Time, Place, and Person)  Thought Content:  Negative  Suicidal Thoughts:  No  Homicidal Thoughts:  No  Memory:  Immediate;   Fair  Judgement:  Fair  Insight:  Fair  Psychomotor Activity:  Normal  Concentration:  Concentration: Fair  Recall:  [employer] of Knowledge:  Fair  Language:  Fair  Akathisia:  No      Assets:  Communication Skills Desire for Improvement  ADL's:  Intact  Cognition:  WNL  Sleep:  Number of Hours: 6     Treatment Plan Summary: 36 yo with history of psychosis admitted due to worsening of symptoms. Pt is improving and organized and goal directed in conversation. She has bright affect and is showing good insight into things she wants and needs to do when she discharges.  Plan Psychosis -Improving -continue Zyprexa  15 mg qhs -Continue Depakote 500 mg TID  Depression -Continue Luvox 100 mg qhs  Insomnia -Continue Restoril 15 mg qhs prn  Alcohol abuse -has not had s/s of w/d -Continue Librium taper -Gabapentin 300 mg TID  Headaches -Topamax 100 mg qhs  Dispo -Discharge home with husband and follow up with Colan Neptune, MD 11/20/2017, 12:28 PM

## 2017-11-20 NOTE — Progress Notes (Signed)
Patient attended afternoon support group. Patient participated in activities.

## 2017-11-20 NOTE — Progress Notes (Signed)
CH follow-up with patient. Patient appeared to be in a good mood as she was smiling and laughing. Patient expressed appreciation for being able to vent freely and stated she had a minor anger flare up yesterday and made amends after the event. Patient stated that most the staff were supportive only a few she thought believed she was looney. Patient stated that medications had helped with her hallucinations and nightmares. Patient stated her husband said that she might be coming home Monday. Patient stated she will continue taking her medications and believes that she is becoming more of who she wants to be and less of who she hates.

## 2017-11-20 NOTE — Plan of Care (Signed)
Patient up in dayroom she is quiet and has poor eye contact. She denies si, hi, avh. She is slow to respond. Safety checks continued. Will continue to monitor.

## 2017-11-20 NOTE — Plan of Care (Signed)
Patient reports improved mood, decreased anxiety. Patient denies SI/HI/AVH. Patient states that she is able to "stay calm and not panic over little things." Patient also states "I am able to smile now and hold a conversation with someone, it is wonderful." Patient contributes change in MH to current medication. Patient able to voice concerns about therapeutic treatment and made decision on care provided.   Progressing Education: Knowledge of [facility] Education information/materials will improve 11/20/2017 2000 - Progressing by Addison Naegelieynolds, Gianny Sabino I, RN Emotional status will improve 11/20/2017 2000 - Progressing by Addison Naegelieynolds, Sixto Bowdish I, RN Mental status will improve 11/20/2017 2000 - Progressing by Berkley Harveyeynolds, Sakai Heinle I, RN Verbalization of understanding the information provided will improve 11/20/2017 2000 - Progressing by Addison Naegelieynolds, Lazette Estala I, RN Coping: Ability to demonstrate self-control will improve 11/20/2017 2000 - Progressing by Addison Naegelieynolds, Sanoe Hazan I, RN Safety: Periods of time without injury will increase 11/20/2017 2000 - Progressing by Berkley Harveyeynolds, Jeilani Grupe I, RN Education: Ability to make informed decisions regarding treatment will improve 11/20/2017 2000 - Progressing by Addison Naegelieynolds, Ellianne Gowen I, RN Coping: Ability to cope will improve 11/20/2017 2000 - Progressing by Addison Naegelieynolds, Amrita Radu I, RN Medication: Compliance with prescribed medication regimen will improve 11/20/2017 2000 - Progressing by Berkley Harveyeynolds, Jenia Klepper I, RN Self-Concept: Ability to disclose and discuss suicidal ideas will improve 11/20/2017 2000 - Progressing by Addison Naegelieynolds, Javani Spratt I, RN Ability to verbalize positive feelings about self will improve 11/20/2017 2000 - Progressing by Addison Naegelieynolds, Dondi Aime I, RN Self-Concept: Ability to identify factors that promote anxiety will improve 11/20/2017 2000 - Progressing by Berkley Harveyeynolds, Luisantonio Adinolfi I, RN Level of anxiety will decrease 11/20/2017 2000 - Progressing by Berkley Harveyeynolds, Azoria Abbett I, RN

## 2017-11-21 MED ORDER — POLYETHYLENE GLYCOL 3350 17 G PO PACK
17.0000 g | PACK | Freq: Every day | ORAL | Status: DC
Start: 2017-11-21 — End: 2017-11-23
  Administered 2017-11-21 – 2017-11-22 (×2): 17 g via ORAL
  Filled 2017-11-21 (×3): qty 1

## 2017-11-21 MED ORDER — DOCUSATE SODIUM 100 MG PO CAPS
100.0000 mg | ORAL_CAPSULE | Freq: Two times a day (BID) | ORAL | Status: DC
  Administered 2017-11-21 – 2017-11-23 (×4): 100 mg via ORAL
  Filled 2017-11-21 (×5): qty 1

## 2017-11-21 NOTE — Plan of Care (Signed)
  Progressing Activity: Interest or engagement in activities will improve 11/21/2017 2033 - Progressing by Addison Naegelieynolds, Sherrell Farish I, RN Note Patient is seen in group this shift Education: Emotional status will improve 11/21/2017 2033 - Progressing by Addison Naegelieynolds, Akshita Italiano I, RN Note Patient states no increase in anxiety or depression Mental status will improve 11/21/2017 2033 - Progressing by Addison Naegelieynolds, Saara Kijowski I, RN Safety: Periods of time without injury will increase 11/21/2017 2033 - Progressing by Addison Naegelieynolds, Ceciley Buist I, RN Note No injury this shift Self-Concept: Ability to disclose and discuss suicidal ideas will improve 11/21/2017 2033 - Progressing by Berkley Harveyeynolds, Eldrige Pitkin I, RN Note Patient denied any SI

## 2017-11-21 NOTE — [facility] Group Notes (Signed)
[facility] Group Notes:  (Nursing/MHT/Case Management/Adjunct)  Date:  11/21/2017  Time:  11:36 PM  Type of Therapy:  Psychoeducational Skills  Participation Level:  Active  Participation Quality:  Appropriate, Attentive and Sharing  Affect:  Appropriate  Cognitive:  Appropriate  Insight:  Appropriate and Good  Engagement in Group:  Engaged  Modes of Intervention:  Discussion, Socialization and Support  Summary of Progress/Problems:  Chancy MilroyLaquanda Y Leilana Mcquire 11/21/2017, 11:36 PM

## 2017-11-21 NOTE — Progress Notes (Signed)
D- Patient alert and oriented. Patient presents apprehensive and preoccupied. Patient states that she slept "excellent, I had no dreams". Patient complains of pain in her "feet and legs, that neurontin works really well". Patient states that she will be going into "phase 2 when I leave on Monday, and my husband said that since I've quit smoking he's going to stop too". Patient complains of having a pouch because she hasn't had a bowel movement and inquires what else she could take for constipation. Patient denies SI, HI, AVH at this time.  A- Scheduled medications administered to patient, per MD orders. Support and encouragement provided.  Routine safety checks conducted every 15 minutes.  Patient informed to notify staff with problems or concerns.  R- No adverse drug reactions noted. Patient contracts for safety at this time. Patient compliant with medications and treatment plan. Patient receptive, calm, and cooperative. Patient interacts well with others on the unit.  Patient remains safe at this time.

## 2017-11-21 NOTE — Progress Notes (Addendum)
[facility]  11/21/2017 11:31 AM Linda Tyler  MRN:  [id number] Subjective:    Pt reporting not having bowel movement for 4 days, denies nausea, vomiting, Reports mood and voices and paranoia are better, medicine helping, denies AVH. Denies SI/HI.    Principal Problem: Schizoaffective disorder, bipolar type (HCC) Diagnosis:   Patient Active Problem List   Diagnosis Date Noted  . PTSD (post-traumatic stress disorder) [F43.10] 11/18/2017  . OCD (obsessive compulsive disorder) [F42.9] 11/18/2017  . Alcohol use disorder, moderate, dependence (HCC) [F10.20] 11/16/2017  . Suicidal ideation [R45.851] 11/16/2017  . Schizoaffective disorder, bipolar type (HCC) [F25.0] 11/15/2017  . S/P hysterectomy [Z90.710] 11/24/2016  . LV dysfunction [I51.9] 07/24/2014  . Hypokalemia [E87.6] 06/24/2012  . Skin rash [R21] 06/23/2012  . Allergic drug rash [L27.0] 06/23/2012  . Cellulitis [L03.90] 06/23/2012  . Tick bite [id number][W57.XXXA] 06/23/2012  . Syncope and collapse [R55] 06/23/2012  . Chest pain on breathing [R07.1] 06/23/2012  . Dehydration [E86.0] 06/23/2012   Total Time spent with patient: 25 min  Past Psychiatric History: See H&P  Past Medical History:  Past Medical History:  Diagnosis Date  . Abnormal uterine bleeding (AUB)   . Cellulitis   . Irritable bowel syndrome with constipation 10/2016  . LV dysfunction    EF 45-50% by 2-D echo in the past  . Skin rash     Past Surgical History:  Procedure Laterality Date  . dental extra  2016  . LAPAROSCOPIC VAGINAL HYSTERECTOMY WITH SALPINGECTOMY N/A 11/24/2016   Procedure: LAPAROSCOPIC ASSISTED VAGINAL HYSTERECTOMY WITH BILATERAL SALPINGECTOMY, LEFT OOPHERECTOMY;  Surgeon: Mitchel HonourMegan Morris, DO;  Location: [facility];  Service: Gynecology;  Laterality: N/A;  . TRANSTHORACIC ECHOCARDIOGRAM  06/25/2012   EF 45% TO 50%. RV- CAVITY SIZE MODERATELY DECREASED.  Marland Kitchen. TUBAL LIGATION  2007   Family History:  Family History   Problem Relation Age of Onset  . Diabetes Mother   . Heart failure Mother   . Heart disease Mother        died in her 6750s with a massive MI  . Heart failure Father   . Hypertension Father    Family Psychiatric  History: See H&P Social History:  Social History   Substance and Sexual Activity  Alcohol Use No     Social History   Substance and Sexual Activity  Drug Use No    Social History   Socioeconomic History  . Marital status: Married    Spouse name: None  . Number of children: None  . Years of education: None  . Highest education level: None  Social Needs  . Financial resource strain: None  . Food insecurity - worry: None  . Food insecurity - inability: None  . Transportation needs - medical: None  . Transportation needs - non-medical: None  Occupational History  . None  Tobacco Use  . Smoking status: Former Smoker    Packs/day: 0.00    Types: Cigarettes    Last attempt to quit: 11/19/2013    Years since quitting: 4.0  . Smokeless tobacco: Never Used  . Tobacco comment: rare cigar at present  Substance and Sexual Activity  . Alcohol use: No  . Drug use: No  . Sexual activity: None  Other Topics Concern  . None  Social History Narrative  . None   Additional Social History:                         Sleep: Good  Appetite:  Good  Current Medications: Current Facility-Administered Medications  Medication Dose Route Frequency Provider Last Rate Last Dose  . acetaminophen (TYLENOL) tablet 650 mg  650 mg Oral Q6H PRN Cindee LameIsbell, Lauren M, MD      . alum & mag hydroxide-simeth (MAALOX/MYLANTA) 200-200-20 MG/5ML suspension 30 mL  30 mL Oral Q4H PRN Cindee LameIsbell, Lauren M, MD   30 mL at 11/21/17 0000  . chlorproMAZINE (THORAZINE) tablet 50 mg  50 mg Oral QID PRN Pucilowska, Jolanta B, MD      . divalproex (DEPAKOTE) DR tablet 500 mg  500 mg Oral Q8H Pucilowska, Jolanta B, MD   500 mg at 11/21/17 [id number]  . docusate sodium (COLACE) capsule 100 mg  100 mg Oral  BID Beverly SessionsSubedi, Waylynn Benefiel, MD      . feeding supplement (ENSURE ENLIVE) (ENSURE ENLIVE) liquid 237 mL  237 mL Oral BID BM Pucilowska, Jolanta B, MD   237 mL at 11/20/17 1413  . fluvoxaMINE (LUVOX) tablet 100 mg  100 mg Oral QHS Pucilowska, Jolanta B, MD   100 mg at 11/20/17 2137  . gabapentin (NEURONTIN) capsule 300 mg  300 mg Oral TID Cindee LameIsbell, Lauren M, MD   300 mg at 11/21/17 [id number]  . ibuprofen (ADVIL,MOTRIN) tablet 800 mg  800 mg Oral Q6H PRN Cindee LameIsbell, Lauren M, MD      . linaclotide Karlene Einstein(LINZESS) capsule 290 mcg  290 mcg Oral QAC breakfast Cindee LameIsbell, Lauren M, MD   290 mcg at 11/21/17 [phone]  . magnesium hydroxide (MILK OF MAGNESIA) suspension 30 mL  30 mL Oral Daily PRN Cindee LameIsbell, Lauren M, MD   30 mL at 11/20/17 1739  . multivitamin with minerals tablet 1 tablet  1 tablet Oral Daily Pucilowska, Jolanta B, MD   1 tablet at 11/21/17 0821  . nicotine (NICODERM CQ - dosed in mg/24 hours) patch 21 mg  21 mg Transdermal Daily Pucilowska, Jolanta B, MD   21 mg at 11/21/17 [id number]  . OLANZapine (ZYPREXA) tablet 15 mg  15 mg Oral QHS Pucilowska, Jolanta B, MD   15 mg at 11/20/17 2137  . polyethylene glycol (MIRALAX / GLYCOLAX) packet 17 g  17 g Oral Daily Beverly SessionsSubedi, Venicia Vandall, MD      . SUMAtriptan (IMITREX) tablet 50 mg  50 mg Oral Q2H PRN Pucilowska, Jolanta B, MD   50 mg at 11/18/17 0958  . temazepam (RESTORIL) capsule 15 mg  15 mg Oral QHS PRN Pucilowska, Jolanta B, MD      . topiramate (TOPAMAX) tablet 100 mg  100 mg Oral QHS Pucilowska, Jolanta B, MD   100 mg at 11/20/17 2138    Lab Results:  No results found for this or any previous visit (from the past 48 hour(s)).  Blood Alcohol level:  Lab Results  Component Value Date   ETH 135 (H) 11/15/2017    Metabolic Disorder Labs: Lab Results  Component Value Date   HGBA1C 5.1 11/16/2017   MPG 100 11/16/2017   No results found for: PROLACTIN Lab Results  Component Value Date   CHOL 175 11/16/2017   TRIG 112 11/16/2017   HDL 66 11/16/2017   CHOLHDL 2.7  11/16/2017   VLDL 22 11/16/2017   LDLCALC 87 11/16/2017    Physical Findings: AIMS: Facial and Oral Movements Muscles of Facial Expression: None, normal Lips and Perioral Area: None, normal Jaw: None, normal Tongue: None, normal,Extremity Movements Upper (arms, wrists, hands, fingers): None, normal Lower (legs, knees, ankles, toes): None, normal, Trunk Movements Neck, shoulders, hips: None, normal,  Overall Severity Severity of abnormal movements (highest score from questions above): None, normal Incapacitation due to abnormal movements: None, normal Patient's awareness of abnormal movements (rate only patient's report): No Awareness, Dental Status Current problems with teeth and/or dentures?: No Does patient usually wear dentures?: No  CIWA:  CIWA-Ar Total: 4 COWS:     Musculoskeletal: Strength & Muscle Tone: within normal limits Gait & Station: normal Patient leans: N/A  Psychiatric Specialty Exam: Physical Exam  Nursing note and vitals reviewed.   ROS  Blood pressure 103/75, pulse 100, temperature 97.8 F (36.6 C), temperature source Oral, resp. rate 18, height 5\' 6"  (1.676 m), weight 60.3 kg (133 lb), last menstrual period 11/13/2016, SpO2 100 %.Body mass index is 21.47 kg/m.  General Appearance: Casual  Eye Contact:  Good  Speech:  Clear and Coherent  Volume:  Normal  Mood:  anxious  Affect:  Constricted  Thought Process:  Goal Directed  Orientation:  Full (Time, Place, and Person)  Thought Content:  Denies AVH  Suicidal Thoughts:  No  Homicidal Thoughts:  No  Memory:  Immediate;   Fair  Judgement:  Fair  Insight:  Fair  Psychomotor Activity:  Normal  Concentration:  Concentration: Fair  Recall:  [employer] of Knowledge:  Fair  Language:  Fair  Akathisia:  No      Assets:  Communication Skills Desire for Improvement  ADL's:  Intact  Cognition:  WNL  Sleep:  Number of Hours: 6.5     Treatment Plan Summary: 36 yo with history of psychosis admitted  due to worsening of symptoms. Pt is improving and organized and goal directed in conversation. She has bright affect and is showing good insight into things she wants and needs to do when she discharges.  Add laxatives for constipation.  Plan Psychosis -Improving -continue Zyprexa 15 mg qhs -Continue Depakote 500 mg TID  Depression -Continue Luvox 100 mg qhs  Insomnia -Continue Restoril 15 mg qhs prn  Alcohol abuse -has not had s/s of w/d -Continue Librium taper -Gabapentin 300 mg TID  Headaches -Topamax 100 mg qhs  Dispo -Discharge home with husband and follow up with Edrick Oh, MD 11/21/2017, 11:31 AM

## 2017-11-21 NOTE — Progress Notes (Signed)
D: Patient denies SI/HI/AVH. Patient presents as pleasant and cooperative during assessment. Patient has no physical complaints at this time. Patient is seen in milieu interacting with peers. Patient endorsing that she "feels better then I am on my meds, and I can feel when they are wearing off." She is smiling sporadically and reports no increase in anxiety or depression. Patient states "My husband says I need to be more assertive with my care here so I will be asking for me things, So next time he is here you can tell him I asked for stuff." Patient is very submissive and asks for permission for multiple things such as leaving the medication room, is she can go to bed, going to the dayroom to make coffee.  A: Patient was assessed by this nurse. Patient received scheduled medications. Q x 15 minute observation checks were completed for safety. Patient was provided with verbal education on provided medications. Patient care plan was reviewed. Patient was offered support and encouragement. Patient was encourage to attend groups, participate in unit activities and continue with plan of care.   R: Patient adheres with scheduled medication. Patient has no complaints of pain at this time. Patient is receptive to treatment and safety maintained on unit. Patient had an estimated 7 hours of restful sleep.
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
## 2017-11-21 NOTE — Progress Notes (Signed)
D: Patient denies SI/HI/AVH. Patient presents as nervous, paranoid, apprehensive during assessment. Patient is seen sporadically smiling. Patient has complaints of constipation. Patient states that her medication "is working, I am able to smile, and hold conversations." Patient also reports she is able to "stay clam and not panic over little things."   A: Patient was assessed by this nurse. Patient encouraged to report any thoughts of SI/HI/AVH to staff.  Patient received scheduled medications. Q x 15 minute observation checks were completed for safety. Patient was provided with verbal education on provided medications. Patient care plan was reviewed. Patient was offered support and encouragement. Patient was encourage to attend groups, participate in unit activities and continue with plan of care.   R: Patient adheres with scheduled medication. Patient responded well to PRN medications and symptoms were relieved. Patient has no complaints of pain at this time. Patient is receptive to treatment and safety maintained on unit. Patient had an estimated 6 hours 30 minutes of restful sleep.

## 2017-11-21 NOTE — Plan of Care (Signed)
Patient comes out of her room for meals and groups on the unit. Patient reports that she slept "excellent" last night with "no dreams". Patient verbalizes understanding of general information/education provided to her. Patient denies SI/HI/AVH at this time and has no signs/symptoms of depression or anxiety. Patient is compliant with medication/therapeutic regimen. Patient has maintained clinical measurements within normal levels. Patient has remained free from injury. Patient has been able to make informed decisions on the unit. Patient states that she is feeling much better and thanks staff for helping her. Patient has been free of complications and has maintained an adequate diet thus far. Patient is functioning at an adequate level and is safe on the unit at this time.

## 2017-11-22 MED ORDER — MAGNESIUM CITRATE PO SOLN
1.0000 | Freq: Once | ORAL | Status: AC
  Administered 2017-11-22: 1 via ORAL
  Filled 2017-11-22: qty 296

## 2017-11-22 NOTE — Progress Notes (Signed)
D:  Pt cooperative and pleasant. Denies SI/HI and AVH. "I'm ready to go home to my husband and cat."     A:  Pt administered scheduled medications. Pt seen in milieu interacting with peers. Q 15 minute checks in place for safety.   R:  Pt compliant with medications and treatment regimen. Pt safety maintained.

## 2017-11-22 NOTE — Progress Notes (Signed)
[facility]  11/22/2017 11:13 AM Linda Tyler  MRN:  [id number] Subjective:    Reports mood and voices and paranoia are better, medicine helping, denies AVH. Isolative and labile at times Denies SI/HI.   Did not voice concern of constipation today.  Principal Problem: Schizoaffective disorder, bipolar type (HCC) Diagnosis:   Patient Active Problem List   Diagnosis Date Noted  . PTSD (post-traumatic stress disorder) [F43.10] 11/18/2017  . OCD (obsessive compulsive disorder) [F42.9] 11/18/2017  . Alcohol use disorder, moderate, dependence (HCC) [F10.20] 11/16/2017  . Suicidal ideation [R45.851] 11/16/2017  . Schizoaffective disorder, bipolar type (HCC) [F25.0] 11/15/2017  . S/P hysterectomy [Z90.710] 11/24/2016  . LV dysfunction [I51.9] 07/24/2014  . Hypokalemia [E87.6] 06/24/2012  . Skin rash [R21] 06/23/2012  . Allergic drug rash [L27.0] 06/23/2012  . Cellulitis [L03.90] 06/23/2012  . Tick bite [id number][W57.XXXA] 06/23/2012  . Syncope and collapse [R55] 06/23/2012  . Chest pain on breathing [R07.1] 06/23/2012  . Dehydration [E86.0] 06/23/2012   Total Time spent with patient: 25 min  Past Psychiatric History: See H&P  Past Medical History:  Past Medical History:  Diagnosis Date  . Abnormal uterine bleeding (AUB)   . Cellulitis   . Irritable bowel syndrome with constipation 10/2016  . LV dysfunction    EF 45-50% by 2-D echo in the past  . Skin rash     Past Surgical History:  Procedure Laterality Date  . dental extra  2016  . LAPAROSCOPIC VAGINAL HYSTERECTOMY WITH SALPINGECTOMY N/A 11/24/2016   Procedure: LAPAROSCOPIC ASSISTED VAGINAL HYSTERECTOMY WITH BILATERAL SALPINGECTOMY, LEFT OOPHERECTOMY;  Surgeon: Mitchel HonourMegan Morris, DO;  Location: [facility];  Service: Gynecology;  Laterality: N/A;  . TRANSTHORACIC ECHOCARDIOGRAM  06/25/2012   EF 45% TO 50%. RV- CAVITY SIZE MODERATELY DECREASED.  Marland Kitchen. TUBAL LIGATION  2007   Family History:  Family History   Problem Relation Age of Onset  . Diabetes Mother   . Heart failure Mother   . Heart disease Mother        died in her 1250s with a massive MI  . Heart failure Father   . Hypertension Father    Family Psychiatric  History: See H&P Social History:  Social History   Substance and Sexual Activity  Alcohol Use No     Social History   Substance and Sexual Activity  Drug Use No    Social History   Socioeconomic History  . Marital status: Married    Spouse name: None  . Number of children: None  . Years of education: None  . Highest education level: None  Social Needs  . Financial resource strain: None  . Food insecurity - worry: None  . Food insecurity - inability: None  . Transportation needs - medical: None  . Transportation needs - non-medical: None  Occupational History  . None  Tobacco Use  . Smoking status: Former Smoker    Packs/day: 0.00    Types: Cigarettes    Last attempt to quit: 11/19/2013    Years since quitting: 4.0  . Smokeless tobacco: Never Used  . Tobacco comment: rare cigar at present  Substance and Sexual Activity  . Alcohol use: No  . Drug use: No  . Sexual activity: None  Other Topics Concern  . None  Social History Narrative  . None   Additional Social History:                         Sleep: Good  Appetite:  Good  Current Medications: Current Facility-Administered Medications  Medication Dose Route Frequency Provider Last Rate Last Dose  . acetaminophen (TYLENOL) tablet 650 mg  650 mg Oral Q6H PRN Cindee Lame, MD      . alum & mag hydroxide-simeth (MAALOX/MYLANTA) 200-200-20 MG/5ML suspension 30 mL  30 mL Oral Q4H PRN Cindee Lame, MD   30 mL at 11/21/17 0000  . chlorproMAZINE (THORAZINE) tablet 50 mg  50 mg Oral QID PRN Pucilowska, Jolanta B, MD      . divalproex (DEPAKOTE) DR tablet 500 mg  500 mg Oral Q8H Pucilowska, Jolanta B, MD   500 mg at 11/22/17 1610  . docusate sodium (COLACE) capsule 100 mg  100 mg Oral  BID Beverly Sessions, MD   100 mg at 11/22/17 0841  . feeding supplement (ENSURE ENLIVE) (ENSURE ENLIVE) liquid 237 mL  237 mL Oral BID BM Pucilowska, Jolanta B, MD   237 mL at 11/22/17 1021  . fluvoxaMINE (LUVOX) tablet 100 mg  100 mg Oral QHS Pucilowska, Jolanta B, MD   100 mg at 11/21/17 2108  . gabapentin (NEURONTIN) capsule 300 mg  300 mg Oral TID Cindee Lame, MD   300 mg at 11/22/17 0841  . ibuprofen (ADVIL,MOTRIN) tablet 800 mg  800 mg Oral Q6H PRN Cindee Lame, MD      . linaclotide Karlene Einstein) capsule 290 mcg  290 mcg Oral QAC breakfast Cindee Lame, MD   290 mcg at 11/22/17 [phone]  . magnesium hydroxide (MILK OF MAGNESIA) suspension 30 mL  30 mL Oral Daily PRN Cindee Lame, MD   30 mL at 11/20/17 1739  . multivitamin with minerals tablet 1 tablet  1 tablet Oral Daily Pucilowska, Jolanta B, MD   1 tablet at 11/22/17 0840  . nicotine (NICODERM CQ - dosed in mg/24 hours) patch 21 mg  21 mg Transdermal Daily Pucilowska, Jolanta B, MD   21 mg at 11/22/17 0841  . OLANZapine (ZYPREXA) tablet 15 mg  15 mg Oral QHS Pucilowska, Jolanta B, MD   15 mg at 11/21/17 2108  . polyethylene glycol (MIRALAX / GLYCOLAX) packet 17 g  17 g Oral Daily Beverly Sessions, MD   17 g at 11/22/17 0840  . SUMAtriptan (IMITREX) tablet 50 mg  50 mg Oral Q2H PRN Pucilowska, Jolanta B, MD   50 mg at 11/18/17 0958  . temazepam (RESTORIL) capsule 15 mg  15 mg Oral QHS PRN Pucilowska, Jolanta B, MD      . topiramate (TOPAMAX) tablet 100 mg  100 mg Oral QHS Pucilowska, Jolanta B, MD   100 mg at 11/21/17 2108    Lab Results:  No results found for this or any previous visit (from the past 48 hour(s)).  Blood Alcohol level:  Lab Results  Component Value Date   ETH 135 (H) 11/15/2017    Metabolic Disorder Labs: Lab Results  Component Value Date   HGBA1C 5.1 11/16/2017   MPG 100 11/16/2017   No results found for: PROLACTIN Lab Results  Component Value Date   CHOL 175 11/16/2017   TRIG 112 11/16/2017    HDL 66 11/16/2017   CHOLHDL 2.7 11/16/2017   VLDL 22 11/16/2017   LDLCALC 87 11/16/2017    Physical Findings: AIMS: Facial and Oral Movements Muscles of Facial Expression: None, normal Lips and Perioral Area: None, normal Jaw: None, normal Tongue: None, normal,Extremity Movements Upper (arms, wrists, hands, fingers): None, normal Lower (legs, knees, ankles, toes): None, normal, Trunk  Movements Neck, shoulders, hips: None, normal, Overall Severity Severity of abnormal movements (highest score from questions above): None, normal Incapacitation due to abnormal movements: None, normal Patient's awareness of abnormal movements (rate only patient's report): No Awareness, Dental Status Current problems with teeth and/or dentures?: No Does patient usually wear dentures?: No  CIWA:  CIWA-Ar Total: 4 COWS:     Musculoskeletal: Strength & Muscle Tone: within normal limits Gait & Station: normal Patient leans: N/A  Psychiatric Specialty Exam: Physical Exam  Nursing note and vitals reviewed.   ROS  Blood pressure 106/80, pulse 92, temperature 97.9 F (36.6 C), temperature source Oral, resp. rate 18, height 5\' 6"  (1.676 m), weight 60.3 kg (133 lb), last menstrual period 11/13/2016, SpO2 100 %.Body mass index is 21.47 kg/m.  General Appearance: Casual  Eye Contact:  Good  Speech:  Clear and Coherent  Volume:  Normal  Mood:  anxious  Affect:  Constricted  Thought Process:  Goal Directed  Orientation:  Full (Time, Place, and Person)  Thought Content:  Denies AVH  Suicidal Thoughts:  No  Homicidal Thoughts:  No  Memory:  Immediate;   Fair  Judgement:  Fair  Insight:  Fair  Psychomotor Activity:  Normal  Concentration:  Concentration: Fair  Recall:  [employer]  Fund of Knowledge:  Fair  Language:  Fair  Akathisia:  No      Assets:  Communication Skills Desire for Improvement  ADL's:  Intact  Cognition:  WNL  Sleep:  Number of Hours: 7     Treatment Plan Summary: 36 yo with  history of psychosis admitted due to worsening of symptoms. Pt is improving and organized and goal directed in conversation. Mood improving slowly but isolative and labile at times . Cont  laxatives for constipation.  Plan Psychosis -Improving -continue Zyprexa 15 mg qhs -Continue Depakote 500 mg TID  Depression -Continue Luvox 100 mg qhs  Insomnia -Continue Restoril 15 mg qhs prn  Alcohol abuse -has not had s/s of w/d -Continue Librium taper -Gabapentin 300 mg TID  Headaches -Topamax 100 mg qhs  Dispo -Discharge home with husband and follow up with Edrick [facility]aymark  Linda Schou, MD 11/22/2017, 11:13 AMPatient ID: Linda Knudsenonya Marie Deats, female   DOB: 06-29-1981, 36 y.o.   MRN: [id number]

## 2017-11-22 NOTE — Plan of Care (Signed)
  Progressing Activity: Interest or engagement in activities will improve 11/22/2017 2229 - Progressing by Berkley Harveyeynolds, Traniece Boffa I, RN Education: Knowledge of [facility] Education information/materials will improve 11/22/2017 2229 - Progressing by Addison Naegelieynolds, Aldin Drees I, RN Emotional status will improve 11/22/2017 2229 - Progressing by Berkley Harveyeynolds, Jnyah Brazee I, RN Mental status will improve 11/22/2017 2229 - Progressing by Addison Naegelieynolds, Leonidas Boateng I, RN Safety: Periods of time without injury will increase 11/22/2017 2229 - Progressing by Berkley Harveyeynolds, Kore Madlock I, RN

## 2017-11-22 NOTE — Progress Notes (Signed)
D: Patient denies SI/HI/AVH. Patient complains of constipation, reports last DM was 5 days ago. On call provider contacted, order obtained for relief. Patient is cooperative and pleasant.   A: Patient was assessed by this nurse. Patient received scheduled medications. Q x 15 minute observation checks were completed for safety. Patient was provided with verbal education on provided medications. Patient care plan was reviewed. Patient was offered support and encouragement. Patient was encourage to attend groups, participate in unit activities and continue with plan of care.   R: Patient adheres with scheduled medication. Patient responded well to PRN medications and symptoms were relieved. Patient has no complaints of pain at this time. Patient is receptive to treatment and safety maintained on unit. Patient had an estimated 6.5 hours of restful sleep.

## 2017-11-23 LAB — VALPROIC ACID LEVEL: Valproic Acid Lvl: 103 ug/mL — ABNORMAL HIGH (ref 50.0–100.0)

## 2017-11-23 MED ORDER — OLANZAPINE 15 MG PO TABS: 15.0000 mg | ORAL_TABLET | Freq: Every day | ORAL | 1 refills | Status: DC

## 2017-11-23 MED ORDER — FLUVOXAMINE MALEATE 100 MG PO TABS: 100.0000 mg | ORAL_TABLET | Freq: Every day | ORAL | 1 refills | Status: DC

## 2017-11-23 MED ORDER — DIVALPROEX SODIUM 500 MG PO DR TAB
500.0000 mg | DELAYED_RELEASE_TABLET | Freq: Two times a day (BID) | ORAL | Status: DC
  Administered 2017-11-23: 500 mg via ORAL
  Filled 2017-11-23: qty 1

## 2017-11-23 MED ORDER — DIVALPROEX SODIUM 500 MG PO DR TAB: 500.0000 mg | DELAYED_RELEASE_TABLET | Freq: Two times a day (BID) | ORAL | 1 refills | Status: DC

## 2017-11-23 MED ORDER — GABAPENTIN 300 MG PO CAPS: 300.0000 mg | ORAL_CAPSULE | Freq: Three times a day (TID) | ORAL | 1 refills | Status: DC
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
## 2017-11-23 NOTE — Progress Notes (Signed)
Patient reports being ready for discharge. Denies SI, HI, AVH. Denies depression and anxiety. Discharge instructions reviewed. Patient to follow up with [facility] within 7 days. Patient verbalized understanding. Suicide Risk Assessment, Discharge instructions, and AVR reviewed and copies provided. Belongings returned including cell phone and wedding band. Discharged to husband/home @ 1455. Patient verbalized gratitude and states that she will "never return." Mood and affect upon discharge is happy.

## 2017-11-23 NOTE — Progress Notes (Signed)
  [facility] Discharge Plan :  Will you be returning to the same living situation after discharge:  Yes,  returning home.  At discharge, do you have transportation home?: Yes,  pt's husband is transporting. Do you have the ability to pay for your medications: Yes,  insurance, husband's income.  Release of information consent forms completed and turned into medical records by CSW.  Patient to Follow up at: Follow-up Information    [facility], [facility] [place]. Go in 7 day(s).   Why:  [facility] reported that new patients need to walk in on Monday-Friday 8:30am to 3pm for the first appointment. Please follow up within 7 days of discharge. Thank you.  Contact information: [address] [place] [address] [phone]           Next level of care provider [facility] Link:no  Safety Planning and Suicide Prevention discussed: Yes,  with pt's husband, Elige RadonBradley.  Have you used any form of tobacco in the last 30 days? (Cigarettes, Smokeless Tobacco, Cigars, and/or Pipes): Yes  Has patient been referred to the Quitline?: Patient refused referral  Patient has been referred for addiction treatment: N/A  Heidi DachKelsey Ibtisam Benge, LCSW 11/23/2017, 9:09 AM

## 2017-11-23 NOTE — [facility] Suicide Risk Assessment (Signed)
[facility]   Principal Problem: Schizoaffective disorder, bipolar type [facility](HCC) Discharge Diagnoses:  Patient Active Problem List   Diagnosis Date Noted  . Schizoaffective disorder, bipolar type (HCC) [F25.0] 11/15/2017    Priority: High  . PTSD (post-traumatic stress disorder) [F43.10] 11/18/2017  . OCD (obsessive compulsive disorder) [F42.9] 11/18/2017  . Alcohol use disorder, moderate, dependence (HCC) [F10.20] 11/16/2017  . Suicidal ideation [R45.851] 11/16/2017  . S/P hysterectomy [Z90.710] 11/24/2016  . LV dysfunction [I51.9] 07/24/2014  . Hypokalemia [E87.6] 06/24/2012  . Skin rash [R21] 06/23/2012  . Allergic drug rash [L27.0] 06/23/2012  . Cellulitis [L03.90] 06/23/2012  . Tick bite [id number][W57.XXXA] 06/23/2012  . Syncope and collapse [R55] 06/23/2012  . Chest pain on breathing [R07.1] 06/23/2012  . Dehydration [E86.0] 06/23/2012    Total Time spent with patient: 30 minutes  Musculoskeletal: Strength & Muscle Tone: within normal limits Gait & Station: normal Patient leans: N/A  Psychiatric Specialty Exam: Review of Systems  Psychiatric/Behavioral: Positive for substance abuse.  All other systems reviewed and are negative.   Blood pressure 98/67, pulse 90, temperature 97.9 F (36.6 C), resp. rate 18, height 5\' 6"  (1.676 m), weight 60.3 kg (133 lb), last menstrual period 11/13/2016, SpO2 100 %.Body mass index is 21.47 kg/m.  General Appearance: Casual  Eye Contact::  Good  Speech:  Clear and Coherent409  Volume:  Normal  Mood:  Euthymic  Affect:  Appropriate  Thought Process:  Goal Directed and Descriptions of Associations: Intact  Orientation:  Full (Time, Place, and Person)  Thought Content:  WDL  Suicidal Thoughts:  No  Homicidal Thoughts:  No  Memory:  Immediate;   Fair Recent;   Fair Remote;   Fair  Judgement:  Impaired  Insight:  Present  Psychomotor Activity:  Normal  Concentration:  Fair  Recall:  FiservFair  Fund of Knowledge:Fair   Language: Fair  Akathisia:  No  Handed:  Right  AIMS (if indicated):     Assets:  Communication Skills Desire for Improvement Housing Intimacy Physical Health Resilience Social Support Transportation  Sleep:  Number of Hours: 7  Cognition: WNL  ADL's:  Intact   Mental Status Per Nursing Assessment::   On Admission:     Demographic Factors:  Caucasian and Unemployed  Loss Factors: NA  Historical Factors: Prior suicide attempts, Family history of mental illness or substance abuse and Impulsivity  Risk Reduction Factors:   Sense of responsibility to family, Religious beliefs about death, Living with another person, especially a relative and Positive social support  Continued Clinical Symptoms:  Bipolar Disorder:   Mixed State Alcohol/Substance Abuse/Dependencies  Cognitive Features That Contribute To Risk:  None    Suicide Risk:  Minimal: No identifiable suicidal ideation.  Patients presenting with no risk factors but with morbid ruminations; may be classified as minimal risk based on the severity of the depressive symptoms  Follow-up Information    [facility] [place]. Go in 7 day(s).   Why:  [facility] reported that new patients need to walk in on Monday-Friday 8:30am to 3pm for the first appointment. Please follow up within 7 days of discharge. Thank you.  Contact information: [address] [place] [address] [phone]           Plan Of Care/Follow-up recommendations:  Activity:  as tolerated Diet:  low sodium heart healthy Other:  keep folow up appointments  Kristine LineaJolanta Pucilowska, MD 11/23/2017, 9:23 AM

## 2017-11-23 NOTE — BHH Group Notes (Signed)
11/23/2017 1:00PM   Type of Therapy and Topic:  Group Therapy:  Overcoming Obstacles   Participation Level:  Did Not Attend   Description of Group:   In this group patients will be encouraged to explore what they see as obstacles to their own wellness and recovery. They will be guided to discuss their thoughts, feelings, and behaviors related to these obstacles. The group will process together ways to cope with barriers, with attention given to specific choices patients can make. Each patient will be challenged to identify changes they are motivated to make in order to overcome their obstacles. This group will be process-oriented, with patients participating in exploration of their own experiences, giving and receiving support, and processing challenge from other group members.   Therapeutic Goals: 1. Patient will identify personal and current obstacles as they relate to admission. 2. Patient will identify barriers that currently interfere with their wellness or overcoming obstacles.  3. Patient will identify feelings, thought process and behaviors related to these barriers. 4. Patient will identify two changes they are willing to make to overcome these obstacles:      Summary of Patient Progress Pt was invited to attend group but chose not to attend. CSW will continue to encourage pt to attend group throughout their admission.     Therapeutic Modalities:   Cognitive Behavioral Therapy Solution Focused Therapy Motivational Interviewing Relapse Prevention Therapy  Heidi DachKelsey Troy Hartzog, MSW, LCSW 11/23/2017 1:39 PM

## 2018-10-11 DIAGNOSIS — R609 Edema, unspecified: Secondary | ICD-10-CM

## 2018-10-11 DIAGNOSIS — R000 Tachycardia, unspecified: Secondary | ICD-10-CM | POA: Diagnosis not present

## 2018-11-22 ENCOUNTER — Encounter: Payer: Self-pay | Admitting: Cardiology

## 2018-11-22 ENCOUNTER — Ambulatory Visit (INDEPENDENT_AMBULATORY_CARE_PROVIDER_SITE_OTHER): Payer: PRIVATE HEALTH INSURANCE | Admitting: Cardiology

## 2018-11-22 DIAGNOSIS — R002 Palpitations: Secondary | ICD-10-CM

## 2018-11-22 MED ORDER — DILTIAZEM HCL ER COATED BEADS 120 MG PO CP24: 120.0000 mg | ORAL_CAPSULE | Freq: Every day | ORAL | 0 refills | Status: DC

## 2018-11-22 NOTE — Patient Instructions (Signed)
Medication Instructions:  Your physician has recommended you make the following change in your medication:   START cardizem 120 mg daily  If you need a refill on your cardiac medications before your next appointment, please call your pharmacy.   Lab work: None  If you have labs (blood work) drawn today and your tests are completely normal, you will receive your results only by: Marland Kitchen. MyChart Message (if you have MyChart) OR . A paper copy in the mail If you have any lab test that is abnormal or we need to change your treatment, we will call you to review the results.  Testing/Procedures: None  Follow-Up: [facility], you and your health needs are our priority.  As part of our continuing mission to provide you with exceptional heart care, we have created designated Provider Care Teams.  These Care Teams include your primary Cardiologist (physician) and Advanced Practice Providers (APPs -  Physician Assistants and Nurse Practitioners) who all work together to provide you with the care you need, when you need it.  You will need a follow up appointment in 3 months.  Please call our office 2 months in advance to schedule this appointment.  You may see another member of our [facility] Provider Team in [place]: Gypsy Balsamobert Krasowski, MD . Norman HerrlichBrian Munley, MD  Any Other Special Instructions Will Be Listed Below (If Applicable).

## 2018-11-22 NOTE — Progress Notes (Signed)
Cardiology Office Note:    Date:  11/22/2018   ID:  Linda Tyler, DOB 08-Apr-1981, MRN [id number]  PCP:  Dema Severin, NP  Cardiologist:  Garwin Brothers, MD   Referring MD: Retia Passe, NP    ASSESSMENT:    1. Palpitations    PLAN:    In order of problems listed above:  1. I discussed my findings with the patient at extensive length.  Her blood pressure is stable.  Her resting heart rate is elevated.  Again her TSH is fine, echocardiogram done at [facility] was unremarkable and her Holter monitoring was reviewed by me. 2. In view of this I have asked her to take Cardizem CD 120 mg daily.  I told her to have adequate salt and water in her diet and told her about possible issues of low blood pressure and she vocalized understanding.  Questions were answered to her satisfaction. 3. She will be seen in follow-up appointment in 2 months or earlier if she has any concerns.  She knows to go to the nearest emergency room for any significant concerns.   Medication Adjustments/Labs and Tests Ordered: Current medicines are reviewed at length with the patient today.  Concerns regarding medicines are outlined above.  No orders of the defined types were placed in this encounter.  Meds ordered this encounter  Medications  . diltiazem (CARDIZEM CD) 120 MG 24 hr capsule    Sig: Take 1 capsule (120 mg total) by mouth daily.    Dispense:  90 capsule    Refill:  0     History of Present Illness:    Linda Tyler is a 37 y.o. female who is being seen today for the evaluation of palpitations and tachycardia at the request of Retia Passe, NP.  Patient is a pleasant 37 year old female.  She is been referred here because of elevated heart rate.  She occasionally feels palpitations.  I reviewed her records.  Her echocardiogram was unremarkable, recent TSH was unremarkable and Holter monitoring revealed elevated average heart rate.  She denies any chest pain orthopnea or  PND.  She has mental health issues.  She also mentions to me that she has no dizziness or any syncopal spells.  She has been told of elevated heart rate since a long time.  At the time of my evaluation, the patient is alert awake oriented and in no distress.  Past Medical History:  Diagnosis Date  . Abnormal uterine bleeding (AUB)   . Cellulitis   . Irritable bowel syndrome with constipation 10/2016  . LV dysfunction    EF 45-50% by 2-D echo in the past  . Skin rash     Past Surgical History:  Procedure Laterality Date  . dental extra  2016  . LAPAROSCOPIC VAGINAL HYSTERECTOMY WITH SALPINGECTOMY N/A 11/24/2016   Procedure: LAPAROSCOPIC ASSISTED VAGINAL HYSTERECTOMY WITH BILATERAL SALPINGECTOMY, LEFT OOPHERECTOMY;  Surgeon: Mitchel Honour, DO;  Location: [facility];  Service: Gynecology;  Laterality: N/A;  . TRANSTHORACIC ECHOCARDIOGRAM  06/25/2012   EF 45% TO 50%. RV- CAVITY SIZE MODERATELY DECREASED.  Marland Kitchen TUBAL LIGATION  2007    Current Medications: Current Meds  Medication Sig  . b complex vitamins capsule Take 1 capsule by mouth daily.  . divalproex (DEPAKOTE) 500 MG DR tablet Take 1 tablet (500 mg total) by mouth every 12 (twelve) hours.  . fluvoxaMINE (LUVOX) 100 MG tablet Take 1 tablet (100 mg total) by mouth at bedtime.  Marland Kitchen  gabapentin (NEURONTIN) 300 MG capsule Take 1 capsule (300 mg total) by mouth 3 (three) times daily.  Marland Kitchen ibuprofen (ADVIL,MOTRIN) 800 MG tablet Take 1 tablet (800 mg total) by mouth every 6 (six) hours as needed.  . linaclotide (LINZESS) 290 MCG CAPS capsule Take 290 mcg by mouth daily before breakfast.  . Multiple Vitamin (MULTIVITAMIN) tablet Take 1 tablet by mouth daily.  Marland Kitchen OLANZapine (ZYPREXA) 15 MG tablet Take 1 tablet (15 mg total) by mouth at bedtime.     Allergies:   Cephalexin; Coconut flavor; Sulfa antibiotics; Penicillins; and Aspirin   Social History   Socioeconomic History  . Marital status: Married    Spouse name: Not on file    . Number of children: Not on file  . Years of education: Not on file  . Highest education level: Not on file  Occupational History  . Not on file  Social Needs  . Financial resource strain: Not on file  . Food insecurity:    Worry: Not on file    Inability: Not on file  . Transportation needs:    Medical: Not on file    Non-medical: Not on file  Tobacco Use  . Smoking status: Current Some Day Smoker    Packs/day: 0.00    Types: Cigarettes, Cigars    Last attempt to quit: 11/19/2013    Years since quitting: 5.0  . Smokeless tobacco: Never Used  . Tobacco comment: rare cigar at present  Substance and Sexual Activity  . Alcohol use: No  . Drug use: No  . Sexual activity: Not on file  Lifestyle  . Physical activity:    Days per week: Not on file    Minutes per session: Not on file  . Stress: Not on file  Relationships  . Social connections:    Talks on phone: Not on file    Gets together: Not on file    Attends religious service: Not on file    Active member of club or organization: Not on file    Attends meetings of clubs or organizations: Not on file    Relationship status: Not on file  Other Topics Concern  . Not on file  Social History Narrative  . Not on file     Family History: The patient's family history includes Diabetes in her mother; Heart disease in her mother; Heart failure in her father and mother; Hypertension in her father.  ROS:   Please see the history of present illness.    All other systems reviewed and are negative.  EKGs/Labs/Other Studies Reviewed:    The following studies were reviewed today: I reviewed [facility] records extensively.   Recent Labs: No results found for requested labs within last 8760 hours.  Recent Lipid Panel    Component Value Date/Time   CHOL 175 11/16/2017 0730   TRIG 112 11/16/2017 0730   HDL 66 11/16/2017 0730   CHOLHDL 2.7 11/16/2017 0730   VLDL 22 11/16/2017 0730   LDLCALC 87 11/16/2017 0730     Physical Exam:    VS:  BP 122/70   Pulse (!) 116   Ht 5\' 6"  (1.676 m)   Wt 229 lb 9.6 oz (104.1 kg)   LMP 11/13/2016 Comment: heavy bleeding since July   SpO2 98%   BMI 37.06 kg/m     Wt Readings from Last 3 Encounters:  11/22/18 229 lb 9.6 oz (104.1 kg)  11/15/17 130 lb (59 kg)  11/24/16 156 lb (70.8 kg)  GEN: Patient is in no acute distress HEENT: Normal NECK: No JVD; No carotid bruits LYMPHATICS: No lymphadenopathy CARDIAC: S1 S2 regular, 2/6 systolic murmur at the apex. RESPIRATORY:  Clear to auscultation without rales, wheezing or rhonchi  ABDOMEN: Soft, non-tender, non-distended MUSCULOSKELETAL:  No edema; No deformity  SKIN: Warm and dry NEUROLOGIC:  Alert and oriented x 3 PSYCHIATRIC:  Normal affect    Signed, Garwin Brothersajan R Tamanika Heiney, MD  11/22/2018 9:40 AM    [facility]

## 2020-06-14 ENCOUNTER — Ambulatory Visit (INDEPENDENT_AMBULATORY_CARE_PROVIDER_SITE_OTHER): Payer: PRIVATE HEALTH INSURANCE

## 2020-06-14 ENCOUNTER — Ambulatory Visit (INDEPENDENT_AMBULATORY_CARE_PROVIDER_SITE_OTHER): Payer: PRIVATE HEALTH INSURANCE | Admitting: Cardiology

## 2020-06-14 ENCOUNTER — Other Ambulatory Visit: Payer: Self-pay

## 2020-06-14 ENCOUNTER — Encounter: Payer: Self-pay | Admitting: Cardiology

## 2020-06-14 VITALS — BP 110/80 | HR 75 | Ht 66.0 in | Wt 147.0 lb

## 2020-06-14 DIAGNOSIS — R002 Palpitations: Secondary | ICD-10-CM | POA: Diagnosis not present

## 2020-06-14 DIAGNOSIS — R011 Cardiac murmur, unspecified: Secondary | ICD-10-CM | POA: Insufficient documentation

## 2020-06-14 LAB — BASIC METABOLIC PANEL
BUN/Creatinine Ratio: 13 (ref 9–23)
BUN: 9 mg/dL (ref 6–20)
CO2: 24 mmol/L (ref 20–29)
Calcium: 9.6 mg/dL (ref 8.7–10.2)
Chloride: 102 mmol/L (ref 96–106)
Creatinine, Ser: 0.67 mg/dL (ref 0.57–1.00)
GFR calc Af Amer: 129 mL/min/{1.73_m2} (ref >59)
GFR calc non Af Amer: 112 mL/min/{1.73_m2} (ref >59)
Glucose: 99 mg/dL (ref 65–99)
Potassium: 3.4 mmol/L — ABNORMAL LOW (ref 3.5–5.2)
Sodium: 141 mmol/L (ref 134–144)

## 2020-06-14 LAB — TSH: TSH: 1.64 u[IU]/mL (ref 0.450–4.500)

## 2020-06-14 NOTE — Patient Instructions (Signed)
Medication Instructions:  No medication changes. *If you need a refill on your cardiac medications before your next appointment, please call your pharmacy*   Lab Work: Your physician recommends that you have a BMET and TSH done today in the office.  If you have labs (blood work) drawn today and your tests are completely normal, you will receive your results only by: Marland Kitchen MyChart Message (if you have MyChart) OR . A paper copy in the mail If you have any lab test that is abnormal or we need to change your treatment, we will call you to review the results.   Testing/Procedures: Your physician has requested that you have an echocardiogram. Echocardiography is a painless test that uses sound waves to create images of your heart. It provides your doctor with information about the size and shape of your heart and how well your heart's chambers and valves are working. This procedure takes approximately one hour. There are no restrictions for this procedure.   WHY IS MY DOCTOR PRESCRIBING ZIO? The Zio system is proven and trusted by physicians to detect and diagnose irregular heart rhythms -- and has been prescribed to hundreds of thousands of patients.  The FDA has cleared the Zio system to monitor for many different kinds of irregular heart rhythms. In a study, physicians were able to reach a diagnosis 90% of the time with the Zio system1.  You can wear the Zio monitor -- a small, discreet, comfortable patch -- during your normal day-to-day activity, including while you sleep, shower, and exercise, while it records every single heartbeat for analysis.  1Barrett, P., et al. Comparison of 24 Hour Holter Monitoring Versus 14 Day Novel Adhesive Patch Electrocardiographic Monitoring. American Journal of Medicine, 2014.  ZIO VS. HOLTER MONITORING The Zio monitor can be comfortably worn for up to 14 days. Holter monitors can be worn for 24 to 48 hours, limiting the time to record any irregular heart  rhythms you may have. Zio is able to capture data for the 51% of patients who have their first symptom-triggered arrhythmia after 48 hours.1  LIVE WITHOUT RESTRICTIONS The Zio ambulatory cardiac monitor is a small, unobtrusive, and water-resistant patch--you might even forget you're wearing it. The Zio monitor records and stores every beat of your heart, whether you're sleeping, working out, or showering.  Wear the monitor for 2 weeks, remove 06/28/20.     Follow-Up: At [facility], you and your health needs are our priority.  As part of our continuing mission to provide you with exceptional heart care, we have created designated Provider Care Teams.  These Care Teams include your primary Cardiologist (physician) and Advanced Practice Providers (APPs -  Physician Assistants and Nurse Practitioners) who all work together to provide you with the care you need, when you need it.  We recommend signing up for the patient portal called "MyChart".  Sign up information is provided on this After Visit Summary.  MyChart is used to connect with patients for Virtual Visits (Telemedicine).  Patients are able to view lab/test results, encounter notes, upcoming appointments, etc.  Non-urgent messages can be sent to your provider as well.   To learn more about what you can do with MyChart, go to [email or web address].    Your next appointment:   2 month(s)  The format for your next appointment:   In Person  Provider:   Belva Crome, MD   Other Instructions  Echocardiogram An echocardiogram is a procedure that uses painless sound waves (ultrasound) to produce an  image of the heart. Images from an echocardiogram can provide important information about:  Signs of coronary artery disease (CAD).  Aneurysm detection. An aneurysm is a weak or damaged part of an artery wall that bulges out from the normal force of blood pumping through the body.  Heart size and shape. Changes in the size or shape of  the heart can be associated with certain conditions, including heart failure, aneurysm, and CAD.  Heart muscle function.  Heart valve function.  Signs of a past heart attack.  Fluid buildup around the heart.  Thickening of the heart muscle.  A tumor or infectious growth around the heart valves. Tell a health care provider about:  Any allergies you have.  All medicines you are taking, including vitamins, herbs, eye drops, creams, and over-the-counter medicines.  Any blood disorders you have.  Any surgeries you have had.  Any medical conditions you have.  Whether you are pregnant or may be pregnant. What are the risks? Generally, this is a safe procedure. However, problems may occur, including:  Allergic reaction to dye (contrast) that may be used during the procedure. What happens before the procedure? No specific preparation is needed. You may eat and drink normally. What happens during the procedure?   An IV tube may be inserted into one of your veins.  You may receive contrast through this tube. A contrast is an injection that improves the quality of the pictures from your heart.  A gel will be applied to your chest.  A wand-like tool (transducer) will be moved over your chest. The gel will help to transmit the sound waves from the transducer.  The sound waves will harmlessly bounce off of your heart to allow the heart images to be captured in real-time motion. The images will be recorded on a computer. The procedure may vary among health care providers and hospitals. What happens after the procedure?  You may return to your normal, everyday life, including diet, activities, and medicines, unless your health care provider tells you not to do that. Summary  An echocardiogram is a procedure that uses painless sound waves (ultrasound) to produce an image of the heart.  Images from an echocardiogram can provide important information about the size and shape of your  heart, heart muscle function, heart valve function, and fluid buildup around your heart.  You do not need to do anything to prepare before this procedure. You may eat and drink normally.  After the echocardiogram is completed, you may return to your normal, everyday life, unless your health care provider tells you not to do that. This information is not intended to replace advice given to you by your health care provider. Make sure you discuss any questions you have with your health care provider. Document Revised: 03/24/2019 Document Reviewed: 01/03/2017 Elsevier Patient Education  2020 [employer].

## 2020-06-14 NOTE — Progress Notes (Signed)
Cardiology Office Note:    Date:  06/14/2020   ID:  Linda Tyler, DOB October 29, 1981, MRN [id number]  PCP:  Dema Severin, NP  Cardiologist:  Garwin Brothers, MD   Referring MD: Dema Severin, NP    ASSESSMENT:    1. Palpitations   2. Cardiac murmur    PLAN:    In order of problems listed above:  1. Primary prevention stressed with the patient.  Importance of compliance with diet medication stressed and she vocalized understanding. 2. Palpitations: I discussed my findings with the patient at extensive length.  In view of this I will get a Chem-7 and TSH.  She will undergo 2-week monitor to understand her symptoms. 3. Cardiac murmur and history of cardiomyopathy: Echocardiogram will be done to assess this.  I advised her to keep her self well-hydrated. 4. It appears that when she does a lot of work outdoors that the heat probably gives her some degree of dehydration and she has palpitations so I Told her to keep herself very well-hydrated.  She is agreeable. 5. Patient will be seen in follow-up appointment in 2 months or earlier if the patient has any concerns 6.    Medication Adjustments/Labs and Tests Ordered: Current medicines are reviewed at length with the patient today.  Concerns regarding medicines are outlined above.  No orders of the defined types were placed in this encounter.  No orders of the defined types were placed in this encounter.    No chief complaint on file.    History of Present Illness:    Linda Tyler is a 39 y.o. female.  Patient has history of mildly depressed ejection fraction in the past, palpitations.  She denies any problems at this time and takes care of activities of daily living.  Her only issue is that palpitations when she works outdoors.  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  She denies any chest pain orthopnea or PND.  At the time of my evaluation, the patient is alert awake oriented and in no distress.  Past Medical  History:  Diagnosis Date  . Abnormal uterine bleeding (AUB)   . Cellulitis   . Congestive heart failure (CHF) (HCC)   . Irritable bowel syndrome with constipation 10/2016  . LV dysfunction    EF 45-50% by 2-D echo in the past  . Schizophrenia (HCC)   . Skin rash     Past Surgical History:  Procedure Laterality Date  . dental extra  2016  . LAPAROSCOPIC VAGINAL HYSTERECTOMY WITH SALPINGECTOMY N/A 11/24/2016   Procedure: LAPAROSCOPIC ASSISTED VAGINAL HYSTERECTOMY WITH BILATERAL SALPINGECTOMY, LEFT OOPHERECTOMY;  Surgeon: Mitchel Honour, DO;  Location: [facility];  Service: Gynecology;  Laterality: N/A;  . TRANSTHORACIC ECHOCARDIOGRAM  06/25/2012   EF 45% TO 50%. RV- CAVITY SIZE MODERATELY DECREASED.  Marland Kitchen TUBAL LIGATION  2007    Current Medications: Current Meds  Medication Sig  . ARISTADA 662 MG/2.4ML prefilled syringe Inject into the muscle.  . divalproex (DEPAKOTE ER) 500 MG 24 hr tablet 1,000 mg in the morning and at bedtime.  Marland Kitchen EPINEPHrine 0.3 mg/0.3 mL IJ SOAJ injection SMARTSIG:0.3 Milligram(s) IM Daily PRN  . fluvoxaMINE (LUVOX) 100 MG tablet Take 1 tablet (100 mg total) by mouth at bedtime.  Marland Kitchen linaclotide (LINZESS) 290 MCG CAPS capsule Take 290 mcg by mouth daily before breakfast.  . Multiple Vitamin (MULTIVITAMIN) tablet Take 1 tablet by mouth daily.  . naproxen sodium (ALEVE) 220 MG tablet  Take 220 mg by mouth daily as needed.  . traZODone (DESYREL) 100 MG tablet Take 200 mg by mouth at bedtime as needed.     Allergies:   Cephalexin, Coconut flavor, Sulfa antibiotics, Vanilla, Penicillins, and Aspirin   Social History   Socioeconomic History  . Marital status: Married    Spouse name: Not on file  . Number of children: Not on file  . Years of education: Not on file  . Highest education level: Not on file  Occupational History  . Not on file  Tobacco Use  . Smoking status: Current Some Day Smoker    Packs/day: 0.00    Types: Cigarettes, Cigars,  E-cigarettes    Last attempt to quit: 11/19/2013    Years since quitting: 6.5  . Smokeless tobacco: Never Used  . Tobacco comment: rare cigar at present  Vaping Use  . Vaping Use: Every day  Substance and Sexual Activity  . Alcohol use: No  . Drug use: No  . Sexual activity: Not on file  Other Topics Concern  . Not on file  Social History Narrative  . Not on file   Social Determinants of Health   Financial Resource Strain:   . Difficulty of Paying Living Expenses:   Food Insecurity:   . Worried About Programme researcher, broadcasting/film/video in the Last Year:   . Barista in the Last Year:   Transportation Needs:   . Freight forwarder (Medical):   Marland Kitchen Lack of Transportation (Non-Medical):   Physical Activity:   . Days of Exercise per Week:   . Minutes of Exercise per Session:   Stress:   . Feeling of Stress :   Social Connections:   . Frequency of Communication with Friends and Family:   . Frequency of Social Gatherings with Friends and Family:   . Attends Religious Services:   . Active Member of Clubs or Organizations:   . Attends Banker Meetings:   Marland Kitchen Marital Status:      Family History: The patient's family history includes Diabetes in her mother; Heart disease in her mother; Heart failure in her father and mother; Hypertension in her father.  ROS:   Please see the history of present illness.    All other systems reviewed and are negative.  EKGs/Labs/Other Studies Reviewed:    The following studies were reviewed today: EKG reveals sinus rhythm and nonspecific ST-T changes   Recent Labs: No results found for requested labs within last 8760 hours.  Recent Lipid Panel    Component Value Date/Time   CHOL 175 11/16/2017 0730   TRIG 112 11/16/2017 0730   HDL 66 11/16/2017 0730   CHOLHDL 2.7 11/16/2017 0730   VLDL 22 11/16/2017 0730   LDLCALC 87 11/16/2017 0730    Physical Exam:    VS:  BP 110/80 (BP Location: Left Arm, Patient Position: Sitting, Cuff  Size: Normal)   Pulse 75   Ht 5\' 6"  (1.676 m)   Wt 147 lb (66.7 kg)   LMP 11/13/2016 Comment: heavy bleeding since July   SpO2 98%   BMI 23.73 kg/m     Wt Readings from Last 3 Encounters:  06/14/20 147 lb (66.7 kg)  11/22/18 229 lb 9.6 oz (104.1 kg)  11/15/17 130 lb (59 kg)     GEN: Patient is in no acute distress HEENT: Normal NECK: No JVD; No carotid bruits LYMPHATICS: No lymphadenopathy CARDIAC: Hear sounds regular, 2/6 systolic murmur at the apex. RESPIRATORY:  Clear to auscultation without rales, wheezing or rhonchi  ABDOMEN: Soft, non-tender, non-distended MUSCULOSKELETAL:  No edema; No deformity  SKIN: Warm and dry NEUROLOGIC:  Alert and oriented x 3 PSYCHIATRIC:  Normal affect   Signed, Garwin Brothers, MD  06/14/2020 8:46 AM    [facility]

## 2020-07-02 ENCOUNTER — Other Ambulatory Visit: Payer: Self-pay

## 2020-07-02 ENCOUNTER — Ambulatory Visit (INDEPENDENT_AMBULATORY_CARE_PROVIDER_SITE_OTHER): Payer: PRIVATE HEALTH INSURANCE

## 2020-07-02 DIAGNOSIS — R002 Palpitations: Secondary | ICD-10-CM | POA: Diagnosis not present

## 2020-07-02 DIAGNOSIS — R011 Cardiac murmur, unspecified: Secondary | ICD-10-CM | POA: Diagnosis not present

## 2020-07-02 LAB — ECHOCARDIOGRAM COMPLETE
Area-P 1/2: 6.83 cm2
S' Lateral: 3.6 cm

## 2020-07-02 NOTE — Progress Notes (Signed)
Complete echocardiogram performed.  Jimmy Jeree Delcid RDCS, RVT  

## 2020-07-25 ENCOUNTER — Telehealth: Payer: Self-pay | Admitting: Cardiology

## 2020-07-25 NOTE — Telephone Encounter (Signed)
Left the patient a message letting her know that Dr. Tomie China can discuss her Echo results at her upcoming appointment & to call back with any other questions.

## 2020-07-25 NOTE — Telephone Encounter (Signed)
Follow Up:   Pt said she was returning a call from today, she did not know who had called her. She also would like her Echo results please.

## 2020-07-30 DIAGNOSIS — I472 Ventricular tachycardia: Secondary | ICD-10-CM

## 2020-07-30 DIAGNOSIS — I4729 Other ventricular tachycardia: Secondary | ICD-10-CM

## 2020-08-21 ENCOUNTER — Ambulatory Visit (INDEPENDENT_AMBULATORY_CARE_PROVIDER_SITE_OTHER): Payer: PRIVATE HEALTH INSURANCE | Admitting: Cardiology

## 2020-08-21 ENCOUNTER — Encounter: Payer: Self-pay | Admitting: Cardiology

## 2020-08-21 ENCOUNTER — Other Ambulatory Visit: Payer: Self-pay

## 2020-08-21 VITALS — BP 112/58 | HR 90 | Ht 66.5 in | Wt 143.0 lb

## 2020-08-21 DIAGNOSIS — R002 Palpitations: Secondary | ICD-10-CM

## 2020-08-21 DIAGNOSIS — F17210 Nicotine dependence, cigarettes, uncomplicated: Secondary | ICD-10-CM | POA: Diagnosis not present

## 2020-08-21 DIAGNOSIS — I472 Ventricular tachycardia: Secondary | ICD-10-CM | POA: Diagnosis not present

## 2020-08-21 DIAGNOSIS — Z8679 Personal history of other diseases of the circulatory system: Secondary | ICD-10-CM

## 2020-08-21 DIAGNOSIS — I4729 Other ventricular tachycardia: Secondary | ICD-10-CM | POA: Insufficient documentation

## 2020-08-21 LAB — BASIC METABOLIC PANEL
BUN/Creatinine Ratio: 10 (ref 9–23)
BUN: 6 mg/dL (ref 6–20)
CO2: 22 mmol/L (ref 20–29)
Calcium: 8.8 mg/dL (ref 8.7–10.2)
Chloride: 105 mmol/L (ref 96–106)
Creatinine, Ser: 0.62 mg/dL (ref 0.57–1.00)
GFR calc Af Amer: 132 mL/min/{1.73_m2} (ref >59)
GFR calc non Af Amer: 115 mL/min/{1.73_m2} (ref >59)
Glucose: 92 mg/dL (ref 65–99)
Potassium: 4.4 mmol/L (ref 3.5–5.2)
Sodium: 141 mmol/L (ref 134–144)

## 2020-08-21 LAB — MAGNESIUM: Magnesium: 1.9 mg/dL (ref 1.6–2.3)

## 2020-08-21 MED ORDER — METOPROLOL SUCCINATE ER 25 MG PO TB24: 25.0000 mg | ORAL_TABLET | Freq: Every day | ORAL | 3 refills | Status: AC

## 2020-08-21 NOTE — Patient Instructions (Addendum)
Medication Instructions:  Start Metoprolol Succinate (Toprolol XL) 25 mg daily   *If you need a refill on your cardiac medications before your next appointment, please call your pharmacy*   Lab Work: Bmp, Magnesium- Today   If you have labs (blood work) drawn today and your tests are completely normal, you will receive your results only by: Marland Kitchen MyChart Message (if you have MyChart) OR . A paper copy in the mail If you have any lab test that is abnormal or we need to change your treatment, we will call you to review the results.   Testing/Procedures: Your physician has requested that you have a lexiscan myoview. For further information please visit [email or web address] Please follow instruction sheet, as given.   Follow-Up: At [facility], you and your health needs are our priority.  As part of our continuing mission to provide you with exceptional heart care, we have created designated Provider Care Teams.  These Care Teams include your primary Cardiologist (physician) and Advanced Practice Providers (APPs -  Physician Assistants and Nurse Practitioners) who all work together to provide you with the care you need, when you need it.  We recommend signing up for the patient portal called "MyChart".  Sign up information is provided on this After Visit Summary.  MyChart is used to connect with patients for Virtual Visits (Telemedicine).  Patients are able to view lab/test results, encounter notes, upcoming appointments, etc.  Non-urgent messages can be sent to your provider as well.   To learn more about what you can do with MyChart, go to [email or web address].    Your next appointment:   6 month(s)  The format for your next appointment:   In Person  Provider:   Belva Crome, MD   Other Instructions None

## 2020-08-21 NOTE — Progress Notes (Signed)
Cardiology Office Note:    Date:  08/21/2020   ID:  Linda Tyler, DOB [birth year], MRN [id number]  PCP:  Dema Severin, NP  Cardiologist:  Garwin Brothers, MD   Referring MD: Dema Severin, NP    ASSESSMENT:    1. Palpitations   2. History of cardiomyopathy   3. Cigarette smoker   4. Nonsustained ventricular tachycardia (HCC)    PLAN:    In order of problems listed above:  1. Primary prevention stressed with the patient.  Importance of compliance with diet medication stressed and she vocalized understanding. 2. Cigarette smoker: I spent 5 minutes with the patient discussing solely about smoking. Smoking cessation was counseled. I suggested to the patient also different medications and pharmacological interventions. Patient is keen to try stopping on its own at this time. He will get back to me if he needs any further assistance in this matter. 3. Nonsustained ventricular tachycardia: Patient has history of cardiomyopathy.  Current ejection fraction is normal.  In view of this finding I will do a Chem-7 and magnesium level today.  She is also pending a Lexiscan sestamibi to assess for any objective evidence of coronary artery disease.  I discussed this with her and she understands.  She will have pregnancy test before she gets the test done and this is made known to her. 4. In view of the above I also initiated on metoprolol succinate 25 mg daily. 5. Patient will be seen in follow-up appointment in 6 months or earlier if the patient has any concerns    Medication Adjustments/Labs and Tests Ordered: Current medicines are reviewed at length with the patient today.  Concerns regarding medicines are outlined above.  No orders of the defined types were placed in this encounter.  No orders of the defined types were placed in this encounter.    No chief complaint on file.    History of Present Illness:    Linda Tyler is a 39 y.o. female.  Patient was evaluated for  palpitations.  She has history of congestive heart failure and left ventricular dysfunction which was mild in the past.  Current ejection fraction is normal.  Unfortunately she continues to smoke.  No chest pain orthopnea or PND.  At the time of my evaluation, the patient is alert awake oriented and in no distress.  Her event monitor revealed nonsustained ventricular tachycardia.  Past Medical History:  Diagnosis Date  . Abnormal uterine bleeding (AUB)   . Cellulitis   . Congestive heart failure (CHF) (HCC)   . Irritable bowel syndrome with constipation 10/2016  . LV dysfunction    EF 45-50% by 2-D echo in the past  . Schizophrenia (HCC)   . Skin rash     Past Surgical History:  Procedure Laterality Date  . dental extra  2016  . LAPAROSCOPIC VAGINAL HYSTERECTOMY WITH SALPINGECTOMY N/A 11/24/2016   Procedure: LAPAROSCOPIC ASSISTED VAGINAL HYSTERECTOMY WITH BILATERAL SALPINGECTOMY, LEFT OOPHERECTOMY;  Surgeon: Mitchel Honour, DO;  Location: [facility];  Service: Gynecology;  Laterality: N/A;  . TRANSTHORACIC ECHOCARDIOGRAM  06/25/2012   EF 45% TO 50%. RV- CAVITY SIZE MODERATELY DECREASED.  Marland Kitchen TUBAL LIGATION  2007    Current Medications: No outpatient medications have been marked as taking for the 08/21/20 encounter (Office Visit) with Keaunna Skipper, Aundra Dubin, MD.     Allergies:   Cephalexin, Coconut flavor, Sulfa antibiotics, Vanilla, Penicillins, and Aspirin   Social History   Socioeconomic History  . Marital status:  Married    Spouse name: Not on file  . Number of children: Not on file  . Years of education: Not on file  . Highest education level: Not on file  Occupational History  . Not on file  Tobacco Use  . Smoking status: Current Some Day Smoker    Packs/day: 0.00    Types: Cigarettes, Cigars, E-cigarettes    Last attempt to quit: 11/19/2013    Years since quitting: 6.7  . Smokeless tobacco: Never Used  . Tobacco comment: rare cigar at present  Vaping Use  .  Vaping Use: Every day  Substance and Sexual Activity  . Alcohol use: No  . Drug use: No  . Sexual activity: Not on file  Other Topics Concern  . Not on file  Social History Narrative  . Not on file   Social Determinants of Health   Financial Resource Strain:   . Difficulty of Paying Living Expenses: Not on file  Food Insecurity:   . Worried About Programme researcher, broadcasting/film/video in the Last Year: Not on file  . Ran Out of Food in the Last Year: Not on file  Transportation Needs:   . Lack of Transportation (Medical): Not on file  . Lack of Transportation (Non-Medical): Not on file  Physical Activity:   . Days of Exercise per Week: Not on file  . Minutes of Exercise per Session: Not on file  Stress:   . Feeling of Stress : Not on file  Social Connections:   . Frequency of Communication with Friends and Family: Not on file  . Frequency of Social Gatherings with Friends and Family: Not on file  . Attends Religious Services: Not on file  . Active Member of Clubs or Organizations: Not on file  . Attends Banker Meetings: Not on file  . Marital Status: Not on file     Family History: The patient's family history includes Diabetes in her mother; Heart disease in her mother; Heart failure in her father and mother; Hypertension in her father.  ROS:   Please see the history of present illness.    All other systems reviewed and are negative.  EKGs/Labs/Other Studies Reviewed:    The following studies were reviewed today: IMPRESSIONS    1. Left ventricular ejection fraction, by estimation, is 60 to 65%. The  left ventricle has normal function. The left ventricle has no regional  wall motion abnormalities. Left ventricular diastolic parameters were  normal.  2. Right ventricular systolic function is normal. The right ventricular  size is normal. There is normal pulmonary artery systolic pressure.  3. The mitral valve is normal in structure. Trivial mitral valve    regurgitation. No evidence of mitral stenosis.  4. The aortic valve was not well visualized. Aortic valve regurgitation  is mild. No aortic stenosis is present.  5. The inferior vena cava is normal in size with greater than 50%  respiratory variability, suggesting right atrial pressure of 3 mmHg.    EVENT MONITOR REPORT:   Patient was monitored from 06/14/2020 to 06/24/2020. Indication:                    Palpitations Ordering physician:  Garwin Brothers, MD  Referring physician:        Garwin Brothers, MD    Baseline rhythm: Sinus  Minimum heart rate: 48 BPM.  Average heart rate:` 94 BPM.  Maximal heart rate 166 BPM.  Atrial arrhythmia: None significant rare brief atrial  runs  Ventricular arrhythmia: One 12 beat nonsustained ventricular tachycardia with an average heart rate of 150  Conduction abnormality: None significant  Symptoms: None significant   Conclusion:  Abnormal event monitor  Interpreting  cardiologist: Garwin Brothers, MD  Date: 07/25/2020 12:08 PM    Recent Labs: 06/14/2020: BUN 9; Creatinine, Ser 0.67; Potassium 3.4; Sodium 141; TSH 1.640  Recent Lipid Panel    Component Value Date/Time   CHOL 175 11/16/2017 0730   TRIG 112 11/16/2017 0730   HDL 66 11/16/2017 0730   CHOLHDL 2.7 11/16/2017 0730   VLDL 22 11/16/2017 0730   LDLCALC 87 11/16/2017 0730    Physical Exam:    VS:  BP (!) 112/58   Pulse 90   Ht 5' 6.5" (1.689 m)   Wt 143 lb (64.9 kg)   LMP 11/13/2016 Comment: heavy bleeding since July   SpO2 98%   BMI 22.74 kg/m     Wt Readings from Last 3 Encounters:  08/21/20 143 lb (64.9 kg)  06/14/20 147 lb (66.7 kg)  11/22/18 229 lb 9.6 oz (104.1 kg)     GEN: Patient is in no acute distress HEENT: Normal NECK: No JVD; No carotid bruits LYMPHATICS: No lymphadenopathy CARDIAC: Hear sounds regular, 2/6 systolic murmur at the apex. RESPIRATORY:  Clear to auscultation without rales, wheezing or rhonchi  ABDOMEN: Soft,  non-tender, non-distended MUSCULOSKELETAL:  No edema; No deformity  SKIN: Warm and dry NEUROLOGIC:  Alert and oriented x 3 PSYCHIATRIC:  Normal affect   Signed, Garwin Brothers, MD  08/21/2020 9:46 AM    [facility]

## 2020-08-22 ENCOUNTER — Telehealth: Payer: Self-pay

## 2020-08-22 NOTE — Telephone Encounter (Signed)
-----   Message from Garwin Brothers, MD sent at 08/21/2020  4:44 PM EDT ----- The results of the study is unremarkable. Please inform patient. I will discuss in detail at next appointment. Cc  primary care/referring physician Garwin Brothers, MD 08/21/2020 4:44 PM

## 2020-08-22 NOTE — Telephone Encounter (Signed)
Left message on patients voicemail to please return our call.   

## 2020-08-22 NOTE — Telephone Encounter (Signed)
Spoke with patient regarding results and recommendation.  Patient verbalizes understanding and is agreeable to plan of care. Advised patient to call back with any issues or concerns.  

## 2020-08-22 NOTE — Telephone Encounter (Signed)
    Pt is returning call from [place] 

## 2020-08-23 ENCOUNTER — Telehealth (HOSPITAL_COMMUNITY): Payer: Self-pay | Admitting: *Deleted

## 2020-08-23 NOTE — Telephone Encounter (Signed)
Left message on voicemail per DPR in reference to upcoming appointment scheduled on 08/30/20 at 11:00 with detailed instructions given per Myocardial Perfusion Study Information Sheet for the test. LM to arrive 15 minutes early, and that it is imperative to arrive on time for appointment to keep from having the test rescheduled. If you need to cancel or reschedule your appointment, please call the office within 24 hours of your appointment. Failure to do so may result in a cancellation of your appointment, and a $50 no show fee. Phone number given for call back for any questions.

## 2020-09-05 ENCOUNTER — Encounter (HOSPITAL_COMMUNITY): Payer: Self-pay | Admitting: *Deleted

## 2020-09-13 ENCOUNTER — Other Ambulatory Visit: Payer: Self-pay

## 2020-09-13 ENCOUNTER — Ambulatory Visit (INDEPENDENT_AMBULATORY_CARE_PROVIDER_SITE_OTHER): Payer: PRIVATE HEALTH INSURANCE

## 2020-09-13 VITALS — Ht 66.5 in | Wt 143.0 lb

## 2020-09-13 DIAGNOSIS — I4729 Other ventricular tachycardia: Secondary | ICD-10-CM

## 2020-09-13 DIAGNOSIS — I472 Ventricular tachycardia: Secondary | ICD-10-CM

## 2020-09-13 DIAGNOSIS — R002 Palpitations: Secondary | ICD-10-CM | POA: Diagnosis not present

## 2020-09-13 LAB — MYOCARDIAL PERFUSION IMAGING
LV dias vol: 106 mL (ref 46–106)
LV sys vol: 43 mL
Peak HR: 99 {beats}/min
Rest HR: 61 {beats}/min
SDS: 1
SRS: 0
SSS: 1
TID: 0.96

## 2020-09-13 MED ORDER — TECHNETIUM TC 99M TETROFOSMIN IV KIT
32.4000 | PACK | Freq: Once | INTRAVENOUS | Status: AC | PRN
  Administered 2020-09-13: 32.4 via INTRAVENOUS

## 2020-09-13 MED ORDER — REGADENOSON 0.4 MG/5ML IV SOLN
0.4000 mg | Freq: Once | INTRAVENOUS | Status: AC
  Administered 2020-09-13: 0.4 mg via INTRAVENOUS

## 2020-09-13 MED ORDER — TECHNETIUM TC 99M TETROFOSMIN IV KIT
10.2000 | PACK | Freq: Once | INTRAVENOUS | Status: AC | PRN
  Administered 2020-09-13: 10.2 via INTRAVENOUS

## 2020-09-18 ENCOUNTER — Telehealth: Payer: Self-pay

## 2020-09-18 NOTE — Telephone Encounter (Signed)
     I went in pt's chart to see who called her. The call was transferred t o Eleonore Chiquito

## 2020-10-29 ENCOUNTER — Telehealth: Payer: Self-pay | Admitting: Cardiology

## 2020-10-29 NOTE — Telephone Encounter (Signed)
Pt aware that we have not received any paperwork as of now.

## 2020-10-29 NOTE — Telephone Encounter (Signed)
New Messag:     Paper work from the [facility] is going to be faxed to Dr Tomie China . She says she need page 4 filled out asap please.. She have already lost her license until paper work is completed.

## 2020-10-30 NOTE — Telephone Encounter (Signed)
Patient wants to know if we have received the paperwork yet.

## 2020-10-31 ENCOUNTER — Telehealth: Payer: Self-pay | Admitting: Cardiology

## 2020-10-31 NOTE — Telephone Encounter (Signed)
Called patient to let her know she needs to come by office to sign forms and pay $29.00 fee. Went straight to VM, unable to leave message due to VM being full.

## 2021-02-18 ENCOUNTER — Ambulatory Visit: Payer: PRIVATE HEALTH INSURANCE | Admitting: Cardiology
